# Patient Record
Sex: Male | Born: 1935
Health system: Southern US, Community
[De-identification: ages and names within clinical notes are randomized; demographics above are authoritative.]

## PROBLEM LIST (undated history)

## (undated) ENCOUNTER — Ambulatory Visit: Payer: Medicare HMO

## (undated) DIAGNOSIS — E785 Hyperlipidemia, unspecified: Secondary | ICD-10-CM

## (undated) DIAGNOSIS — I10 Essential (primary) hypertension: Secondary | ICD-10-CM

## (undated) HISTORY — PX: APPENDECTOMY: SHX54

## (undated) HISTORY — DX: Hyperlipidemia, unspecified: E78.5

---

## 2004-01-13 ENCOUNTER — Ambulatory Visit (HOSPITAL_COMMUNITY): Admission: RE | Admit: 2004-01-13 | Discharge: 2004-01-13 | Payer: Self-pay | Admitting: *Deleted

## 2006-04-25 ENCOUNTER — Ambulatory Visit (HOSPITAL_COMMUNITY): Admission: RE | Admit: 2006-04-25 | Discharge: 2006-04-25 | Payer: Self-pay | Admitting: *Deleted

## 2011-08-16 ENCOUNTER — Ambulatory Visit (INDEPENDENT_AMBULATORY_CARE_PROVIDER_SITE_OTHER): Payer: MEDICARE | Admitting: Emergency Medicine

## 2011-08-16 VITALS — BP 119/56 | HR 69 | Temp 97.8°F | Resp 16 | Ht 69.0 in | Wt 155.0 lb

## 2011-08-16 DIAGNOSIS — S61409A Unspecified open wound of unspecified hand, initial encounter: Secondary | ICD-10-CM

## 2011-08-16 DIAGNOSIS — Z23 Encounter for immunization: Secondary | ICD-10-CM

## 2011-08-16 MED ORDER — TETANUS-DIPHTH-ACELL PERTUSSIS 5-2.5-18.5 LF-MCG/0.5 IM SUSP
0.5000 mL | Freq: Once | INTRAMUSCULAR | Status: AC
  Administered 2011-08-16: 0.5 mL via INTRAMUSCULAR

## 2011-08-16 MED ORDER — DOXYCYCLINE HYCLATE 100 MG PO TABS: 100.0000 mg | ORAL_TABLET | Freq: Two times a day (BID) | ORAL | Status: AC

## 2011-08-16 NOTE — Progress Notes (Signed)
  Subjective:    Patient ID: Joshua Adkins, male    DOB: 11-09-1935, 76 y.o.   MRN: [id number]  HPI patient enters with a wound on his left hand he apparently was working on a set of dentures with a knife and the knife slipped and he lacerated the web space of his left hand. He closed it with the tape in enters today for a followup 24 hours later.    Review of Systems patient unclear of when he last had tetanus shot.     Objective:   Physical Exam physical exam reveals a 2 cm laceration in the webspace of the left hand the wound is between the index finger. Laceration of the skin here. There is no active bleeding. This area was cleaned thoroughly with peroxide. The wound margins were approximated with Steri-Strips. Covered with a sterile Telfa and wrap.        Assessment & Plan:   Assessment is wound pain. Wound is 24 hours duration. Because of this no sutures will be placed.

## 2011-11-01 ENCOUNTER — Other Ambulatory Visit: Payer: Self-pay | Admitting: Gastroenterology

## 2012-05-06 ENCOUNTER — Encounter (HOSPITAL_COMMUNITY): Payer: Self-pay | Admitting: Emergency Medicine

## 2012-05-06 ENCOUNTER — Emergency Department (HOSPITAL_COMMUNITY)
Admission: EM | Admit: 2012-05-06 | Discharge: 2012-05-07 | Disposition: A | Discharge status: Home / Self Care | Payer: Medicare Other | Attending: Emergency Medicine | Admitting: Emergency Medicine

## 2012-05-06 DIAGNOSIS — Z7982 Long term (current) use of aspirin: Secondary | ICD-10-CM | POA: Insufficient documentation

## 2012-05-06 DIAGNOSIS — I10 Essential (primary) hypertension: Secondary | ICD-10-CM | POA: Insufficient documentation

## 2012-05-06 DIAGNOSIS — Z79899 Other long term (current) drug therapy: Secondary | ICD-10-CM | POA: Insufficient documentation

## 2012-05-06 HISTORY — DX: Essential (primary) hypertension: I10

## 2012-05-06 NOTE — ED Notes (Signed)
Pt complains of blood pressure being high and feeling jittery; pt got on treadmill and blood pressure elevated from home readings.  Denies chest pain and SOB.

## 2012-05-07 NOTE — ED Provider Notes (Signed)
History     CSN: [id number]  Arrival date & time 05/06/12  2301   First MD Initiated Contact with Patient 05/06/12 2353      Chief Complaint  Patient presents with  . Hypertension    (Consider location/radiation/quality/duration/timing/severity/associated sxs/prior treatment) HPI 76 year male presents to emergency apartment complaining of elevated blood pressure. Patient reports he was feeling "jittery" this evening, and so he checked his blood pressure. His blood pressure when he checked it was 146 systolic. Patient got on the treadmill, when he got off the treadmill, his blood pressure was 175/80. He denies any chest pain, shortness of breath, headache, blurred vision or any focal neuro symptoms. Patient felt jittery. Patient is on 2 blood pressure medicines, one half of a 10 mg lisinopril, and one half of a Maxzide 75/50. Patient reports when he was on full-strength of these medications he felt like his blood pressure was too low. He denies any problems doing his nightly workout on the treadmill. Patient became concerned, and checked his blood pressure several more times, and reports the systolic continued to climb. He has continued to be asymptomatic.    Past Medical History  Diagnosis Date  . Hypertension     Past Surgical History  Procedure Date  . Appendectomy     History reviewed. No pertinent family history.  History  Substance Use Topics  . Smoking status: Never Smoker   . Smokeless tobacco: Not on file  . Alcohol Use: No      Review of Systems   See History of Present Illness; otherwise all other systems are reviewed and negative  Allergies  Caduet  Home Medications   Current Outpatient Rx  Name  Route  Sig  Dispense  Refill  . VITAMIN C 100 MG PO TABS   Oral   Take 100 mg by mouth daily.         . ASPIRIN EC 81 MG PO TBEC   Oral   Take 81 mg by mouth daily.         . ATORVASTATIN CALCIUM 10 MG PO TABS   Oral   Take 10 mg by mouth daily.        Marland Kitchen LISINOPRIL 10 MG PO TABS   Oral   Take 5 mg by mouth daily.          Marland Kitchen OVER THE COUNTER MEDICATION   Oral   Take 1 capsule by mouth daily. Fish oil with vitamin D         . TRIAMTERENE-HCTZ 37.5-25 MG PO CAPS   Oral   Take 1 capsule by mouth every morning.         Marland Kitchen VITAMIN E 100 UNITS PO CAPS   Oral   Take 100 Units by mouth every other day.            BP 149/60  Pulse 70  Temp 98.8 F (37.1 C) (Oral)  Resp 19  SpO2 100%  Physical Exam  Nursing note and vitals reviewed. Constitutional: He is oriented to person, place, and time. He appears well-developed and well-nourished.  HENT:  Head: Normocephalic and atraumatic.  Nose: Nose normal.  Mouth/Throat: Oropharynx is clear and moist.  Eyes: Conjunctivae normal and EOM are normal. Pupils are equal, round, and reactive to light.  Neck: Normal range of motion. Neck supple. No JVD present. No tracheal deviation present. No thyromegaly present.  Cardiovascular: Normal rate, regular rhythm, normal heart sounds and intact distal pulses.  Exam reveals no gallop  and no friction rub.   No murmur heard. Pulmonary/Chest: Effort normal and breath sounds normal. No stridor. No respiratory distress. He has no wheezes. He has no rales. He exhibits no tenderness.  Abdominal: Soft. Bowel sounds are normal. He exhibits no distension and no mass. There is no tenderness. There is no rebound and no guarding.  Musculoskeletal: Normal range of motion. He exhibits no edema and no tenderness.  Lymphadenopathy:    He has no cervical adenopathy.  Neurological: He is oriented to person, place, and time. He has normal reflexes. No cranial nerve deficit. He exhibits normal muscle tone. Coordination normal.  Skin: Skin is dry. No rash noted. No erythema. No pallor.  Psychiatric: He has a normal mood and affect. His behavior is normal. Judgment and thought content normal.    ED Course  Procedures (including critical care time)  Labs  Reviewed - No data to display No results found.   1. Hypertension       MDM  Patient with improved blood pressures here. No endorgan damage issues. Patient given the option of starting full-strength on one of his medications or keeping a log of his blood pressures twice a day until he can followup with his primary care Dr. on Friday. Do not feel patient has malignant hypertension or hypertensive urgency/crisis        Olivia Mackie, MD 05/07/12 0502

## 2012-10-22 ENCOUNTER — Other Ambulatory Visit: Payer: Self-pay | Admitting: Dermatology

## 2015-02-02 ENCOUNTER — Telehealth: Payer: Self-pay | Admitting: Internal Medicine

## 2015-02-02 NOTE — Telephone Encounter (Signed)
Pt called wanting to establish care with you. He states his daughter, Stephanie Coup, had asked you a while ago if you would take him on as a new patient and you agreed. Pt states daughter has passed since then.

## 2015-02-03 NOTE — Telephone Encounter (Signed)
Appt made

## 2015-02-03 NOTE — Telephone Encounter (Signed)
Ok Thx 

## 2015-02-17 ENCOUNTER — Encounter: Payer: Self-pay | Admitting: Internal Medicine

## 2015-02-17 ENCOUNTER — Ambulatory Visit (INDEPENDENT_AMBULATORY_CARE_PROVIDER_SITE_OTHER): Payer: Medicare Other | Admitting: Internal Medicine

## 2015-02-17 VITALS — BP 138/72 | HR 79 | Ht 69.5 in | Wt 150.0 lb

## 2015-02-17 DIAGNOSIS — Z0000 Encounter for general adult medical examination without abnormal findings: Secondary | ICD-10-CM | POA: Diagnosis not present

## 2015-02-17 DIAGNOSIS — I10 Essential (primary) hypertension: Secondary | ICD-10-CM | POA: Diagnosis not present

## 2015-02-17 DIAGNOSIS — H6121 Impacted cerumen, right ear: Secondary | ICD-10-CM | POA: Diagnosis not present

## 2015-02-17 DIAGNOSIS — H833X3 Noise effects on inner ear, bilateral: Secondary | ICD-10-CM

## 2015-02-17 DIAGNOSIS — H833X9 Noise effects on inner ear, unspecified ear: Secondary | ICD-10-CM | POA: Insufficient documentation

## 2015-02-17 DIAGNOSIS — H6120 Impacted cerumen, unspecified ear: Secondary | ICD-10-CM | POA: Insufficient documentation

## 2015-02-17 DIAGNOSIS — E785 Hyperlipidemia, unspecified: Secondary | ICD-10-CM | POA: Diagnosis not present

## 2015-02-17 DIAGNOSIS — N320 Bladder-neck obstruction: Secondary | ICD-10-CM

## 2015-02-17 DIAGNOSIS — H9190 Unspecified hearing loss, unspecified ear: Secondary | ICD-10-CM | POA: Insufficient documentation

## 2015-02-17 MED ORDER — ATORVASTATIN CALCIUM 10 MG PO TABS: 10.0000 mg | ORAL_TABLET | Freq: Every day | ORAL | Status: DC

## 2015-02-17 MED ORDER — TRIAMTERENE-HCTZ 37.5-25 MG PO TABS: 0.5000 | ORAL_TABLET | Freq: Every day | ORAL | Status: DC

## 2015-02-17 MED ORDER — LISINOPRIL 10 MG PO TABS: 5.0000 mg | ORAL_TABLET | Freq: Every day | ORAL | Status: DC

## 2015-02-17 NOTE — Assessment & Plan Note (Signed)
See procedure 

## 2015-02-17 NOTE — Assessment & Plan Note (Addendum)
  Here for medicare wellness/physical  Diet: heart healthy  Physical activity: not sedentary  Depression/mood screen: negative  Hearing: decreased Visual acuity: grossly normal, performs annual eye exam  ADLs: capable  Fall risk: none  Home safety: good  Cognitive evaluation: intact to orientation, naming, recall and repetition  EOL planning: adv directives, full code/ I agree  I have personally reviewed and have noted  1. The patient's medical, surgical and social history  2. Their use of alcohol, tobacco or illicit drugs  3. Their current medications and supplements  4. The patient's functional ability including ADL's, fall risks, home safety risks and hearing or visual impairment.  5. Diet and physical activities  6. Evidence for depression or mood disorders 7. The roster of all physicians providing medical care to patient - is listed in the Snapshot section of the chart and reviewed today.    Today patient counseled on age appropriate routine health concerns for screening and prevention, each reviewed and up to date or declined. Immunizations reviewed and up to date or declined. Labs ordered and reviewed. Risk factors for depression reviewed and negative. Hearing function and visual acuity are intact. ADLs screened and addressed as needed. Functional ability and level of safety reviewed and appropriate. Education, counseling and referrals performed based on assessed risks today. Patient provided with a copy of personalized plan for preventive services.   Colon Dr Paulita Fujita ?2013 Pt declined a flu shot Labs

## 2015-02-17 NOTE — Assessment & Plan Note (Signed)
On Lipitor 

## 2015-02-17 NOTE — Progress Notes (Signed)
Pre visit review using our clinic review tool, if applicable. No additional management support is needed unless otherwise documented below in the visit note. 

## 2015-02-17 NOTE — Patient Instructions (Signed)
Preventive Care for Adults A healthy lifestyle and preventive care can promote health and wellness. Preventive health guidelines for men include the following key practices:  A routine yearly physical is a good way to check with your health care provider about your health and preventative screening. It is a chance to share any concerns and updates on your health and to receive a thorough exam.  Visit your dentist for a routine exam and preventative care every 6 months. Brush your teeth twice a day and floss once a day. Good oral hygiene prevents tooth decay and gum disease.  The frequency of eye exams is based on your age, health, family medical history, use of contact lenses, and other factors. Follow your health care provider's recommendations for frequency of eye exams.  Eat a healthy diet. Foods such as vegetables, fruits, whole grains, low-fat dairy products, and lean protein foods contain the nutrients you need without too many calories. Decrease your intake of foods high in solid fats, added sugars, and salt. Eat the right amount of calories for you.Get information about a proper diet from your health care provider, if necessary.  Regular physical exercise is one of the most important things you can do for your health. Most adults should get at least 150 minutes of moderate-intensity exercise (any activity that increases your heart rate and causes you to sweat) each week. In addition, most adults need muscle-strengthening exercises on 2 or more days a week.  Maintain a healthy weight. The body mass index (BMI) is a screening tool to identify possible weight problems. It provides an estimate of body fat based on height and weight. Your health care provider can find your BMI and can help you achieve or maintain a healthy weight.For adults 20 years and older:  A BMI below 18.5 is considered underweight.  A BMI of 18.5 to 24.9 is normal.  A BMI of 25 to 29.9 is considered overweight.  A BMI  of 30 and above is considered obese.  Maintain normal blood lipids and cholesterol levels by exercising and minimizing your intake of saturated fat. Eat a balanced diet with plenty of fruit and vegetables. Blood tests for lipids and cholesterol should begin at age 50 and be repeated every 5 years. If your lipid or cholesterol levels are high, you are over 50, or you are at high risk for heart disease, you may need your cholesterol levels checked more frequently.Ongoing high lipid and cholesterol levels should be treated with medicines if diet and exercise are not working.  If you smoke, find out from your health care provider how to quit. If you do not use tobacco, do not start.  Lung cancer screening is recommended for adults aged 73-80 years who are at high risk for developing lung cancer because of a history of smoking. A yearly low-dose CT scan of the lungs is recommended for people who have at least a 30-pack-year history of smoking and are a current smoker or have quit within the past 15 years. A pack year of smoking is smoking an average of 1 pack of cigarettes a day for 1 year (for example: 1 pack a day for 30 years or 2 packs a day for 15 years). Yearly screening should continue until the smoker has stopped smoking for at least 15 years. Yearly screening should be stopped for people who develop a health problem that would prevent them from having lung cancer treatment.  If you choose to drink alcohol, do not have more than  2 drinks per day. One drink is considered to be 12 ounces (355 mL) of beer, 5 ounces (148 mL) of wine, or 1.5 ounces (44 mL) of liquor.  Avoid use of street drugs. Do not share needles with anyone. Ask for help if you need support or instructions about stopping the use of drugs.  High blood pressure causes heart disease and increases the risk of stroke. Your blood pressure should be checked at least every 1-2 years. Ongoing high blood pressure should be treated with  medicines, if weight loss and exercise are not effective.  If you are 45-79 years old, ask your health care provider if you should take aspirin to prevent heart disease.  Diabetes screening involves taking a blood sample to check your fasting blood sugar level. This should be done once every 3 years, after age 45, if you are within normal weight and without risk factors for diabetes. Testing should be considered at a younger age or be carried out more frequently if you are overweight and have at least 1 risk factor for diabetes.  Colorectal cancer can be detected and often prevented. Most routine colorectal cancer screening begins at the age of 50 and continues through age 75. However, your health care provider may recommend screening at an earlier age if you have risk factors for colon cancer. On a yearly basis, your health care provider may provide home test kits to check for hidden blood in the stool. Use of a small camera at the end of a tube to directly examine the colon (sigmoidoscopy or colonoscopy) can detect the earliest forms of colorectal cancer. Talk to your health care provider about this at age 50, when routine screening begins. Direct exam of the colon should be repeated every 5-10 years through age 75, unless early forms of precancerous polyps or small growths are found.  People who are at an increased risk for hepatitis B should be screened for this virus. You are considered at high risk for hepatitis B if:  You were born in a country where hepatitis B occurs often. Talk with your health care provider about which countries are considered high risk.  Your parents were born in a high-risk country and you have not received a shot to protect against hepatitis B (hepatitis B vaccine).  You have HIV or AIDS.  You use needles to inject street drugs.  You live with, or have sex with, someone who has hepatitis B.  You are a man who has sex with other men (MSM).  You get hemodialysis  treatment.  You take certain medicines for conditions such as cancer, organ transplantation, and autoimmune conditions.  Hepatitis C blood testing is recommended for all people born from 1945 through 1965 and any individual with known risks for hepatitis C.  Practice safe sex. Use condoms and avoid high-risk sexual practices to reduce the spread of sexually transmitted infections (STIs). STIs include gonorrhea, chlamydia, syphilis, trichomonas, herpes, HPV, and human immunodeficiency virus (HIV). Herpes, HIV, and HPV are viral illnesses that have no cure. They can result in disability, cancer, and death.  If you are at risk of being infected with HIV, it is recommended that you take a prescription medicine daily to prevent HIV infection. This is called preexposure prophylaxis (PrEP). You are considered at risk if:  You are a man who has sex with other men (MSM) and have other risk factors.  You are a heterosexual man, are sexually active, and are at increased risk for HIV infection.    You take drugs by injection.  You are sexually active with a partner who has HIV.  Talk with your health care provider about whether you are at high risk of being infected with HIV. If you choose to begin PrEP, you should first be tested for HIV. You should then be tested every 3 months for as long as you are taking PrEP.  A one-time screening for abdominal aortic aneurysm (AAA) and surgical repair of large AAAs by ultrasound are recommended for men ages 51 to 13 years who are current or former smokers.  Healthy men should no longer receive prostate-specific antigen (PSA) blood tests as part of routine cancer screening. Talk with your health care provider about prostate cancer screening.  Testicular cancer screening is not recommended for adult males who have no symptoms. Screening includes self-exam, a health care provider exam, and other screening tests. Consult with your health care provider about any symptoms  you have or any concerns you have about testicular cancer.  Use sunscreen. Apply sunscreen liberally and repeatedly throughout the day. You should seek shade when your shadow is shorter than you. Protect yourself by wearing long sleeves, pants, a wide-brimmed hat, and sunglasses year round, whenever you are outdoors.  Once a month, do a whole-body skin exam, using a mirror to look at the skin on your back. Tell your health care provider about new moles, moles that have irregular borders, moles that are larger than a pencil eraser, or moles that have changed in shape or color.  Stay current with required vaccines (immunizations).  Influenza vaccine. All adults should be immunized every year.  Tetanus, diphtheria, and acellular pertussis (Td, Tdap) vaccine. An adult who has not previously received Tdap or who does not know his vaccine status should receive 1 dose of Tdap. This initial dose should be followed by tetanus and diphtheria toxoids (Td) booster doses every 10 years. Adults with an unknown or incomplete history of completing a 3-dose immunization series with Td-containing vaccines should begin or complete a primary immunization series including a Tdap dose. Adults should receive a Td booster every 10 years.  Varicella vaccine. An adult without evidence of immunity to varicella should receive 2 doses or a second dose if he has previously received 1 dose.  Human papillomavirus (HPV) vaccine. Males aged 84-21 years who have not received the vaccine previously should receive the 3-dose series. Males aged 22-26 years may be immunized. Immunization is recommended through the age of 57 years for any male who has sex with males and did not get any or all doses earlier. Immunization is recommended for any person with an immunocompromised condition through the age of 71 years if he did not get any or all doses earlier. During the 3-dose series, the second dose should be obtained 4-8 weeks after the first  dose. The third dose should be obtained 24 weeks after the first dose and 16 weeks after the second dose.  Zoster vaccine. One dose is recommended for adults aged 73 years or older unless certain conditions are present.  Measles, mumps, and rubella (MMR) vaccine. Adults born before [birth year] generally are considered immune to measles and mumps. Adults born in [birth year] or later should have 1 or more doses of MMR vaccine unless there is a contraindication to the vaccine or there is laboratory evidence of immunity to each of the three diseases. A routine second dose of MMR vaccine should be obtained at least 28 days after the first dose for students attending postsecondary  schools, health care workers, or international travelers. People who received inactivated measles vaccine or an unknown type of measles vaccine during 1963-1967 should receive 2 doses of MMR vaccine. People who received inactivated mumps vaccine or an unknown type of mumps vaccine before 1979 and are at high risk for mumps infection should consider immunization with 2 doses of MMR vaccine. Unvaccinated health care workers born before [birth year] who lack laboratory evidence of measles, mumps, or rubella immunity or laboratory confirmation of disease should consider measles and mumps immunization with 2 doses of MMR vaccine or rubella immunization with 1 dose of MMR vaccine.  Pneumococcal 13-valent conjugate (PCV13) vaccine. When indicated, a person who is uncertain of his immunization history and has no record of immunization should receive the PCV13 vaccine. An adult aged 74 years or older who has certain medical conditions and has not been previously immunized should receive 1 dose of PCV13 vaccine. This PCV13 should be followed with a dose of pneumococcal polysaccharide (PPSV23) vaccine. The PPSV23 vaccine dose should be obtained at least 8 weeks after the dose of PCV13 vaccine. An adult aged 39 years or older who has certain medical conditions and  previously received 1 or more doses of PPSV23 vaccine should receive 1 dose of PCV13. The PCV13 vaccine dose should be obtained 1 or more years after the last PPSV23 vaccine dose.  Pneumococcal polysaccharide (PPSV23) vaccine. When PCV13 is also indicated, PCV13 should be obtained first. All adults aged 57 years and older should be immunized. An adult younger than age 27 years who has certain medical conditions should be immunized. Any person who resides in a nursing home or long-term care facility should be immunized. An adult smoker should be immunized. People with an immunocompromised condition and certain other conditions should receive both PCV13 and PPSV23 vaccines. People with human immunodeficiency virus (HIV) infection should be immunized as soon as possible after diagnosis. Immunization during chemotherapy or radiation therapy should be avoided. Routine use of PPSV23 vaccine is not recommended for American Indians, [place] Natives, or people younger than 65 years unless there are medical conditions that require PPSV23 vaccine. When indicated, people who have unknown immunization and have no record of immunization should receive PPSV23 vaccine. One-time revaccination 5 years after the first dose of PPSV23 is recommended for people aged 19-64 years who have chronic kidney failure, nephrotic syndrome, asplenia, or immunocompromised conditions. People who received 1-2 doses of PPSV23 before age 86 years should receive another dose of PPSV23 vaccine at age 23 years or later if at least 5 years have passed since the previous dose. Doses of PPSV23 are not needed for people immunized with PPSV23 at or after age 51 years.  Meningococcal vaccine. Adults with asplenia or persistent complement component deficiencies should receive 2 doses of quadrivalent meningococcal conjugate (MenACWY-D) vaccine. The doses should be obtained at least 2 months apart. Microbiologists working with certain meningococcal bacteria,  [employer], people at risk during an outbreak, and people who travel to or live in countries with a high rate of meningitis should be immunized. A first-year college student up through age 58 years who is living in a residence hall should receive a dose if he did not receive a dose on or after his 16th birthday. Adults who have certain high-risk conditions should receive one or more doses of vaccine.  Hepatitis A vaccine. Adults who wish to be protected from this disease, have certain high-risk conditions, work with hepatitis A-infected animals, work in hepatitis A research labs, or  travel to or work in countries with a high rate of hepatitis A should be immunized. Adults who were previously unvaccinated and who anticipate close contact with an international adoptee during the first 60 days after arrival in the [place] from a country with a high rate of hepatitis A should be immunized.  Hepatitis B vaccine. Adults should be immunized if they wish to be protected from this disease, have certain high-risk conditions, may be exposed to blood or other infectious body fluids, are household contacts or sex partners of hepatitis B positive people, are clients or workers in certain care facilities, or travel to or work in countries with a high rate of hepatitis B.  Haemophilus influenzae type b (Hib) vaccine. A previously unvaccinated person with asplenia or sickle cell disease or having a scheduled splenectomy should receive 1 dose of Hib vaccine. Regardless of previous immunization, a recipient of a hematopoietic stem cell transplant should receive a 3-dose series 6-12 months after his successful transplant. Hib vaccine is not recommended for adults with HIV infection. Preventive Service / Frequency Ages 52 to 17  Blood pressure check.** / Every 1 to 2 years.  Lipid and cholesterol check.** / Every 5 years beginning at age 69.  Hepatitis C blood test.** / For any individual with known risks for  hepatitis C.  Skin self-exam. / Monthly.  Influenza vaccine. / Every year.  Tetanus, diphtheria, and acellular pertussis (Tdap, Td) vaccine.** / Consult your health care provider. 1 dose of Td every 10 years.  Varicella vaccine.** / Consult your health care provider.  HPV vaccine. / 3 doses over 6 months, if 72 or younger.  Measles, mumps, rubella (MMR) vaccine.** / You need at least 1 dose of MMR if you were born in 1957 or later. You may also need a second dose.  Pneumococcal 13-valent conjugate (PCV13) vaccine.** / Consult your health care provider.  Pneumococcal polysaccharide (PPSV23) vaccine.** / 1 to 2 doses if you smoke cigarettes or if you have certain conditions.  Meningococcal vaccine.** / 1 dose if you are age 35 to 60 years and a Market researcher living in a residence hall, or have one of several medical conditions. You may also need additional booster doses.  Hepatitis A vaccine.** / Consult your health care provider.  Hepatitis B vaccine.** / Consult your health care provider.  Haemophilus influenzae type b (Hib) vaccine.** / Consult your health care provider. Ages 35 to 8  Blood pressure check.** / Every 1 to 2 years.  Lipid and cholesterol check.** / Every 5 years beginning at age 57.  Lung cancer screening. / Every year if you are aged 44-80 years and have a 30-pack-year history of smoking and currently smoke or have quit within the past 15 years. Yearly screening is stopped once you have quit smoking for at least 15 years or develop a health problem that would prevent you from having lung cancer treatment.  Fecal occult blood test (FOBT) of stool. / Every year beginning at age 55 and continuing until age 73. You may not have to do this test if you get a colonoscopy every 10 years.  Flexible sigmoidoscopy** or colonoscopy.** / Every 5 years for a flexible sigmoidoscopy or every 10 years for a colonoscopy beginning at age 28 and continuing until age  1.  Hepatitis C blood test.** / For all people born from [birth year] through 1965 and any individual with known risks for hepatitis C.  Skin self-exam. / Monthly.  Influenza vaccine. / Every  year.  Tetanus, diphtheria, and acellular pertussis (Tdap/Td) vaccine.** / Consult your health care provider. 1 dose of Td every 10 years.  Varicella vaccine.** / Consult your health care provider.  Zoster vaccine.** / 1 dose for adults aged 53 years or older.  Measles, mumps, rubella (MMR) vaccine.** / You need at least 1 dose of MMR if you were born in 1955 or later. You may also need a second dose.  Pneumococcal 13-valent conjugate (PCV13) vaccine.** / Consult your health care provider.  Pneumococcal polysaccharide (PPSV23) vaccine.** / 1 to 2 doses if you smoke cigarettes or if you have certain conditions.  Meningococcal vaccine.** / Consult your health care provider.  Hepatitis A vaccine.** / Consult your health care provider.  Hepatitis B vaccine.** / Consult your health care provider.  Haemophilus influenzae type b (Hib) vaccine.** / Consult your health care provider. Ages 77 and over  Blood pressure check.** / Every 1 to 2 years.  Lipid and cholesterol check.**/ Every 5 years beginning at age 85.  Lung cancer screening. / Every year if you are aged 55-80 years and have a 30-pack-year history of smoking and currently smoke or have quit within the past 15 years. Yearly screening is stopped once you have quit smoking for at least 15 years or develop a health problem that would prevent you from having lung cancer treatment.  Fecal occult blood test (FOBT) of stool. / Every year beginning at age 33 and continuing until age 11. You may not have to do this test if you get a colonoscopy every 10 years.  Flexible sigmoidoscopy** or colonoscopy.** / Every 5 years for a flexible sigmoidoscopy or every 10 years for a colonoscopy beginning at age 28 and continuing until age 73.  Hepatitis C blood  test.** / For all people born from [birth year] through 1963 and any individual with known risks for hepatitis C.  Abdominal aortic aneurysm (AAA) screening.** / A one-time screening for ages 50 to 27 years who are current or former smokers.  Skin self-exam. / Monthly.  Influenza vaccine. / Every year.  Tetanus, diphtheria, and acellular pertussis (Tdap/Td) vaccine.** / 1 dose of Td every 10 years.  Varicella vaccine.** / Consult your health care provider.  Zoster vaccine.** / 1 dose for adults aged 34 years or older.  Pneumococcal 13-valent conjugate (PCV13) vaccine.** / Consult your health care provider.  Pneumococcal polysaccharide (PPSV23) vaccine.** / 1 dose for all adults aged 63 years and older.  Meningococcal vaccine.** / Consult your health care provider.  Hepatitis A vaccine.** / Consult your health care provider.  Hepatitis B vaccine.** / Consult your health care provider.  Haemophilus influenzae type b (Hib) vaccine.** / Consult your health care provider. **Family history and personal history of risk and conditions may change your health care provider's recommendations. Document Released: 08/06/2001 Document Revised: 06/15/2013 Document Reviewed: 11/05/2010 [facility] Patient Information 2015 [place], [place]. This information is not intended to replace advice given to you by your health care provider. Make sure you discuss any questions you have with your health care provider.

## 2015-02-17 NOTE — Assessment & Plan Note (Signed)
Chronic 30+ years Maxzide, Lisinopril

## 2015-02-17 NOTE — Progress Notes (Signed)
Subjective:  Patient ID: Joshua Adkins, male    DOB: [birth year]  Age: 79 y.o. MRN: [id number]  CC: Establish Care   HPI [name] presents for a new pt visit - well exam  Outpatient Prescriptions Prior to Visit  Medication Sig Dispense Refill  . Ascorbic Acid (VITAMIN C) 100 MG tablet Take 100 mg by mouth daily.    Marland Kitchen aspirin EC 81 MG tablet Take 81 mg by mouth daily.    Marland Kitchen atorvastatin (LIPITOR) 10 MG tablet Take 10 mg by mouth daily.    Marland Kitchen lisinopril (PRINIVIL,ZESTRIL) 10 MG tablet Take 5 mg by mouth daily.     Marland Kitchen OVER THE COUNTER MEDICATION Take 1 capsule by mouth daily. Fish oil with vitamin D    . triamterene-hydrochlorothiazide (DYAZIDE) 37.5-25 MG per capsule Take 1 capsule by mouth every morning.    . vitamin E 100 UNIT capsule Take 100 Units by mouth every other day.      No facility-administered medications prior to visit.    ROS Review of Systems  Constitutional: Negative for appetite change, fatigue and unexpected weight change.  HENT: Positive for hearing loss. Negative for congestion, ear discharge, ear pain, nosebleeds, sneezing, sore throat and trouble swallowing.   Eyes: Negative for itching and visual disturbance.  Respiratory: Negative for cough.   Cardiovascular: Negative for chest pain, palpitations and leg swelling.  Gastrointestinal: Negative for nausea, diarrhea, blood in stool and abdominal distention.  Genitourinary: Negative for frequency and hematuria.  Musculoskeletal: Negative for back pain, joint swelling, gait problem and neck pain.  Skin: Negative for rash.  Neurological: Negative for dizziness, tremors, speech difficulty and weakness.  Psychiatric/Behavioral: Negative for suicidal ideas, behavioral problems, sleep disturbance, self-injury, dysphoric mood and agitation. The patient is not nervous/anxious.     Objective:  BP 138/72 mmHg  Pulse 79  Ht 5' 9.5" (1.765 m)  Wt 150 lb (68.04 kg)  BMI 21.84 kg/m2  SpO2 97%  BP Readings from  Last 3 Encounters:  02/17/15 138/72  05/07/12 149/60  08/16/11 119/56    Wt Readings from Last 3 Encounters:  02/17/15 150 lb (68.04 kg)  08/16/11 155 lb (70.308 kg)    Physical Exam  Constitutional: He is oriented to person, place, and time. He appears well-developed and well-nourished. No distress.  HENT:  Head: Normocephalic and atraumatic.  Right Ear: External ear normal.  Left Ear: External ear normal.  Nose: Nose normal.  Mouth/Throat: Oropharynx is clear and moist. No oropharyngeal exudate.  Eyes: Conjunctivae and EOM are normal. Pupils are equal, round, and reactive to light. Right eye exhibits no discharge. Left eye exhibits no discharge. No scleral icterus.  Neck: Normal range of motion. Neck supple. No JVD present. No tracheal deviation present. No thyromegaly present.  Cardiovascular: Normal rate, regular rhythm, normal heart sounds and intact distal pulses.  Exam reveals no gallop and no friction rub.   No murmur heard. Pulmonary/Chest: Effort normal and breath sounds normal. No stridor. No respiratory distress. He has no wheezes. He has no rales. He exhibits no tenderness.  Abdominal: Soft. Bowel sounds are normal. He exhibits no distension and no mass. There is no tenderness. There is no rebound and no guarding.  Genitourinary: Rectum normal, prostate normal and penis normal. Guaiac negative stool. No penile tenderness.  Musculoskeletal: Normal range of motion. He exhibits no edema or tenderness.  Lymphadenopathy:    He has no cervical adenopathy.  Neurological: He is alert and oriented to person, place, and time.  He has normal reflexes. No cranial nerve deficit. He exhibits normal muscle tone. Coordination normal.  Skin: Skin is warm and dry. No rash noted. He is not diaphoretic. No erythema. No pallor.  Psychiatric: He has a normal mood and affect. His behavior is normal. Judgment and thought content normal.  Wax R Hard hearing  EKG NSR   Procedure Note :      Procedure :  Ear irrigation   Indication:  Cerumen impaction R   Risks, including pain, dizziness, eardrum perforation, bleeding, infection and others as well as benefits were explained to the patient in detail. Verbal consent was obtained and the patient agreed to proceed.    We used "The Elephant Ear Irrigation Device" filled with lukewarm water for irrigation. A large amount wax was recovered. Procedure has also required manual wax removal with an ear loop.   Tolerated well. Complications: None.   Postprocedure instructions :  Call if problems.    No results found for: WBC, HGB, HCT, PLT, GLUCOSE, CHOL, TRIG, HDL, LDLDIRECT, LDLCALC, ALT, AST, NA, K, CL, CREATININE, BUN, CO2, TSH, PSA, INR, GLUF, HGBA1C, MICROALBUR  No results found.  Assessment & Plan:   [name] was seen today for establish care.  Diagnoses and all orders for this visit:  Well adult exam -     EKG 12-Lead -     CBC with Differential/Platelet; Future -     Basic metabolic panel; Future -     Hepatic function panel; Future -     Lipid panel; Future -     PSA; Future -     TSH; Future -     Urinalysis; Future  Essential hypertension -     CBC with Differential/Platelet; Future -     Basic metabolic panel; Future -     Hepatic function panel; Future -     Lipid panel; Future -     PSA; Future -     TSH; Future -     Urinalysis; Future  Dyslipidemia -     CBC with Differential/Platelet; Future -     Basic metabolic panel; Future -     Hepatic function panel; Future -     Lipid panel; Future -     PSA; Future -     TSH; Future -     Urinalysis; Future  Cerumen impaction, right -     CBC with Differential/Platelet; Future -     Basic metabolic panel; Future -     Hepatic function panel; Future -     Lipid panel; Future -     PSA; Future -     TSH; Future -     Urinalysis; Future  Hearing loss d/t noise, bilateral -     Ambulatory referral to Audiology -     CBC with Differential/Platelet;  Future -     Basic metabolic panel; Future -     Hepatic function panel; Future -     Lipid panel; Future -     PSA; Future -     TSH; Future -     Urinalysis; Future  Bladder neck obstruction -     PSA; Future  Other orders -     atorvastatin (LIPITOR) 10 MG tablet; Take 1 tablet (10 mg total) by mouth daily. -     triamterene-hydrochlorothiazide (MAXZIDE-25) 37.5-25 MG per tablet; Take 0.5 tablets by mouth daily. -     lisinopril (PRINIVIL,ZESTRIL) 10 MG tablet; Take 0.5 tablets (5 mg total)  by mouth daily.  I have discontinued Mr. Capri's vitamin E, OVER THE COUNTER MEDICATION, and Fish Oil. I have also changed his atorvastatin and lisinopril. Additionally, I am having him maintain his vitamin C, aspirin EC, Cholecalciferol, and triamterene-hydrochlorothiazide.  Meds ordered this encounter  Medications  . Cholecalciferol 1000 UNITS tablet    Sig: Take 1,000 Units by mouth every other day.  Marland Kitchen DISCONTD: Omega-3 Fatty Acids (FISH OIL) 1200 MG CAPS    Sig: Take by mouth daily.  Marland Kitchen DISCONTD: triamterene-hydrochlorothiazide (MAXZIDE-25) 37.5-25 MG per tablet    Sig: Take 0.5 tablets by mouth daily.    Refill:  1  . atorvastatin (LIPITOR) 10 MG tablet    Sig: Take 1 tablet (10 mg total) by mouth daily.    Dispense:  180 tablet    Refill:  1  . triamterene-hydrochlorothiazide (MAXZIDE-25) 37.5-25 MG per tablet    Sig: Take 0.5 tablets by mouth daily.    Dispense:  90 tablet    Refill:  3  . lisinopril (PRINIVIL,ZESTRIL) 10 MG tablet    Sig: Take 0.5 tablets (5 mg total) by mouth daily.    Dispense:  90 tablet    Refill:  3     Follow-up: Return in about 6 months (around 08/20/2015) for a follow-up visit.  Walker Kehr, MD

## 2015-02-17 NOTE — Assessment & Plan Note (Signed)
Audiol ref 

## 2015-02-21 ENCOUNTER — Other Ambulatory Visit (INDEPENDENT_AMBULATORY_CARE_PROVIDER_SITE_OTHER): Payer: Medicare Other

## 2015-02-21 DIAGNOSIS — I10 Essential (primary) hypertension: Secondary | ICD-10-CM | POA: Diagnosis not present

## 2015-02-21 DIAGNOSIS — H833X3 Noise effects on inner ear, bilateral: Secondary | ICD-10-CM

## 2015-02-21 DIAGNOSIS — H6121 Impacted cerumen, right ear: Secondary | ICD-10-CM | POA: Diagnosis not present

## 2015-02-21 DIAGNOSIS — Z0000 Encounter for general adult medical examination without abnormal findings: Secondary | ICD-10-CM

## 2015-02-21 DIAGNOSIS — E785 Hyperlipidemia, unspecified: Secondary | ICD-10-CM

## 2015-02-21 DIAGNOSIS — N320 Bladder-neck obstruction: Secondary | ICD-10-CM

## 2015-02-21 LAB — BASIC METABOLIC PANEL
BUN: 32 mg/dL — ABNORMAL HIGH (ref 6–23)
CALCIUM: 9.5 mg/dL (ref 8.4–10.5)
CO2: 31 meq/L (ref 19–32)
CREATININE: 1.25 mg/dL (ref 0.40–1.50)
Chloride: 105 mEq/L (ref 96–112)
GFR: 59.49 mL/min — ABNORMAL LOW (ref >60.00)
GLUCOSE: 93 mg/dL (ref 70–99)
Potassium: 4.8 mEq/L (ref 3.5–5.1)
Sodium: 141 mEq/L (ref 135–145)

## 2015-02-21 LAB — HEPATIC FUNCTION PANEL
ALBUMIN: 4.1 g/dL (ref 3.5–5.2)
ALK PHOS: 42 U/L (ref 39–117)
ALT: 2 U/L (ref 0–53)
AST: 17 U/L (ref 0–37)
Bilirubin, Direct: 0.1 mg/dL (ref 0.0–0.3)
TOTAL PROTEIN: 6.6 g/dL (ref 6.0–8.3)
Total Bilirubin: 0.4 mg/dL (ref 0.2–1.2)

## 2015-02-21 LAB — URINALYSIS
BILIRUBIN URINE: NEGATIVE
HGB URINE DIPSTICK: NEGATIVE
Ketones, ur: NEGATIVE
Leukocytes, UA: NEGATIVE
Nitrite: NEGATIVE
Specific Gravity, Urine: 1.015 (ref 1.000–1.030)
Total Protein, Urine: NEGATIVE
URINE GLUCOSE: NEGATIVE
UROBILINOGEN UA: 0.2 (ref 0.0–1.0)
pH: 7 (ref 5.0–8.0)

## 2015-02-21 LAB — PSA: PSA: 1.2 ng/mL (ref 0.10–4.00)

## 2015-02-21 LAB — CBC WITH DIFFERENTIAL/PLATELET
Basophils Absolute: 0 10*3/uL (ref 0.0–0.1)
Basophils Relative: 0.2 % (ref 0.0–3.0)
EOS PCT: 0 % (ref 0.0–5.0)
Eosinophils Absolute: 0 10*3/uL (ref 0.0–0.7)
HCT: 35.3 % — ABNORMAL LOW (ref 39.0–52.0)
HEMOGLOBIN: 12 g/dL — AB (ref 13.0–17.0)
LYMPHS PCT: 42.3 % (ref 12.0–46.0)
Lymphs Abs: 2.4 10*3/uL (ref 0.7–4.0)
MCHC: 34 g/dL (ref 30.0–36.0)
MCV: 86.6 fl (ref 78.0–100.0)
MONOS PCT: 9.7 % (ref 3.0–12.0)
Monocytes Absolute: 0.5 10*3/uL (ref 0.1–1.0)
Neutro Abs: 2.7 10*3/uL (ref 1.4–7.7)
Neutrophils Relative %: 47.8 % (ref 43.0–77.0)
Platelets: 124 10*3/uL — ABNORMAL LOW (ref 150.0–400.0)
RBC: 4.08 Mil/uL — AB (ref 4.22–5.81)
RDW: 14.8 % (ref 11.5–15.5)
WBC: 5.6 10*3/uL (ref 4.0–10.5)

## 2015-02-21 LAB — LIPID PANEL
Cholesterol: 201 mg/dL — ABNORMAL HIGH (ref 0–200)
HDL: 38.1 mg/dL — AB (ref >39.00)
LDL Cholesterol: 127 mg/dL — ABNORMAL HIGH (ref 0–99)
NONHDL: 162.49
TRIGLYCERIDES: 176 mg/dL — AB (ref 0.0–149.0)
Total CHOL/HDL Ratio: 5
VLDL: 35.2 mg/dL (ref 0.0–40.0)

## 2015-02-21 LAB — TSH: TSH: 2.66 u[IU]/mL (ref 0.35–4.50)

## 2015-03-02 ENCOUNTER — Other Ambulatory Visit: Payer: Self-pay | Admitting: *Deleted

## 2015-03-02 DIAGNOSIS — D649 Anemia, unspecified: Secondary | ICD-10-CM

## 2015-06-08 ENCOUNTER — Other Ambulatory Visit (INDEPENDENT_AMBULATORY_CARE_PROVIDER_SITE_OTHER): Payer: Medicare Other

## 2015-06-08 DIAGNOSIS — D649 Anemia, unspecified: Secondary | ICD-10-CM

## 2015-06-08 LAB — CBC WITH DIFFERENTIAL/PLATELET
BASOS PCT: 0.2 % (ref 0.0–3.0)
Basophils Absolute: 0 10*3/uL (ref 0.0–0.1)
EOS ABS: 0 10*3/uL (ref 0.0–0.7)
EOS PCT: 0 % (ref 0.0–5.0)
HCT: 36.9 % — ABNORMAL LOW (ref 39.0–52.0)
HEMOGLOBIN: 12.3 g/dL — AB (ref 13.0–17.0)
LYMPHS ABS: 2.2 10*3/uL (ref 0.7–4.0)
Lymphocytes Relative: 39.7 % (ref 12.0–46.0)
MCHC: 33.3 g/dL (ref 30.0–36.0)
MCV: 86.8 fl (ref 78.0–100.0)
MONO ABS: 0.6 10*3/uL (ref 0.1–1.0)
Monocytes Relative: 9.8 % (ref 3.0–12.0)
NEUTROS ABS: 2.8 10*3/uL (ref 1.4–7.7)
Neutrophils Relative %: 50.3 % (ref 43.0–77.0)
PLATELETS: 137 10*3/uL — AB (ref 150.0–400.0)
RBC: 4.24 Mil/uL (ref 4.22–5.81)
RDW: 14.2 % (ref 11.5–15.5)
WBC: 5.6 10*3/uL (ref 4.0–10.5)

## 2015-06-08 LAB — IRON AND TIBC
%SAT: 33 % (ref 15–60)
IRON: 85 ug/dL (ref 50–180)
TIBC: 258 ug/dL (ref 250–425)
UIBC: 173 ug/dL (ref 125–400)

## 2015-08-18 ENCOUNTER — Ambulatory Visit: Payer: Medicare Other | Admitting: Internal Medicine

## 2015-09-05 ENCOUNTER — Encounter: Payer: Self-pay | Admitting: Internal Medicine

## 2015-09-05 ENCOUNTER — Ambulatory Visit (INDEPENDENT_AMBULATORY_CARE_PROVIDER_SITE_OTHER): Payer: Medicare Other | Admitting: Internal Medicine

## 2015-09-05 VITALS — BP 160/90 | HR 72 | Wt 152.0 lb

## 2015-09-05 DIAGNOSIS — W57XXXA Bitten or stung by nonvenomous insect and other nonvenomous arthropods, initial encounter: Secondary | ICD-10-CM

## 2015-09-05 DIAGNOSIS — H833X3 Noise effects on inner ear, bilateral: Secondary | ICD-10-CM

## 2015-09-05 DIAGNOSIS — T148 Other injury of unspecified body region: Secondary | ICD-10-CM

## 2015-09-05 DIAGNOSIS — E785 Hyperlipidemia, unspecified: Secondary | ICD-10-CM

## 2015-09-05 DIAGNOSIS — I10 Essential (primary) hypertension: Secondary | ICD-10-CM | POA: Diagnosis not present

## 2015-09-05 MED ORDER — LOSARTAN POTASSIUM 100 MG PO TABS: 100.0000 mg | ORAL_TABLET | Freq: Every day | ORAL | Status: DC

## 2015-09-05 NOTE — Assessment & Plan Note (Signed)
Pt decided against hearing aids at present

## 2015-09-05 NOTE — Progress Notes (Signed)
Pre visit review using our clinic review tool, if applicable. No additional management support is needed unless otherwise documented below in the visit note. 

## 2015-09-05 NOTE — Assessment & Plan Note (Signed)
On Lipitor 

## 2015-09-05 NOTE — Progress Notes (Signed)
Subjective:  Patient ID: Joshua Adkins, male    DOB: Mar 10, 1936  Age: 80 y.o. MRN: [id number]  CC: No chief complaint on file.   HPI BEATRIZ JENNISON presents for HTN, dyslipidemia f/u. He had tic bite in summer of 2016 w/mild topical rash and no other sx's  Outpatient Prescriptions Prior to Visit  Medication Sig Dispense Refill  . aspirin EC 81 MG tablet Take 81 mg by mouth daily.    Marland Kitchen atorvastatin (LIPITOR) 10 MG tablet Take 1 tablet (10 mg total) by mouth daily. 180 tablet 1  . Cholecalciferol 1000 UNITS tablet Take 1,000 Units by mouth every other day.    . triamterene-hydrochlorothiazide (MAXZIDE-25) 37.5-25 MG per tablet Take 0.5 tablets by mouth daily. 90 tablet 3  . lisinopril (PRINIVIL,ZESTRIL) 10 MG tablet Take 0.5 tablets (5 mg total) by mouth daily. 90 tablet 3  . Ascorbic Acid (VITAMIN C) 100 MG tablet Take 100 mg by mouth daily. Reported on 09/05/2015     No facility-administered medications prior to visit.    ROS Review of Systems  Constitutional: Negative for appetite change, fatigue and unexpected weight change.  HENT: Negative for congestion, nosebleeds, sneezing, sore throat and trouble swallowing.   Eyes: Negative for itching and visual disturbance.  Respiratory: Negative for cough.   Cardiovascular: Negative for chest pain, palpitations and leg swelling.  Gastrointestinal: Negative for nausea, diarrhea, blood in stool and abdominal distention.  Genitourinary: Negative for frequency and hematuria.  Musculoskeletal: Negative for back pain, joint swelling, gait problem and neck pain.  Skin: Negative for rash.  Neurological: Negative for dizziness, tremors, speech difficulty and weakness.  Psychiatric/Behavioral: Negative for sleep disturbance, dysphoric mood and agitation. The patient is not nervous/anxious.     Objective:  BP 178/76 mmHg  Pulse 72  Wt 152 lb (68.947 kg)  SpO2 96%  BP Readings from Last 3 Encounters:  09/05/15 178/76  02/17/15 138/72    05/07/12 149/60    Wt Readings from Last 3 Encounters:  09/05/15 152 lb (68.947 kg)  02/17/15 150 lb (68.04 kg)  08/16/11 155 lb (70.308 kg)    Physical Exam  Constitutional: He is oriented to person, place, and time. He appears well-developed. No distress.  NAD  HENT:  Mouth/Throat: Oropharynx is clear and moist.  Eyes: Conjunctivae are normal. Pupils are equal, round, and reactive to light.  Neck: Normal range of motion. No JVD present. No thyromegaly present.  Cardiovascular: Normal rate, regular rhythm, normal heart sounds and intact distal pulses.  Exam reveals no gallop and no friction rub.   No murmur heard. Pulmonary/Chest: Effort normal and breath sounds normal. No respiratory distress. He has no wheezes. He has no rales. He exhibits no tenderness.  Abdominal: Soft. Bowel sounds are normal. He exhibits no distension and no mass. There is no tenderness. There is no rebound and no guarding.  Musculoskeletal: Normal range of motion. He exhibits no edema or tenderness.  Lymphadenopathy:    He has no cervical adenopathy.  Neurological: He is alert and oriented to person, place, and time. He has normal reflexes. No cranial nerve deficit. He exhibits normal muscle tone. He displays a negative Romberg sign. Coordination and gait normal.  Skin: Skin is warm and dry. No rash noted.  Psychiatric: He has a normal mood and affect. His behavior is normal. Judgment and thought content normal.  hard hearing  Lab Results  Component Value Date   WBC 5.6 06/08/2015   HGB 12.3* 06/08/2015   HCT 36.9*  06/08/2015   PLT 137.0* 06/08/2015   GLUCOSE 93 02/21/2015   CHOL 201* 02/21/2015   TRIG 176.0* 02/21/2015   HDL 38.10* 02/21/2015   LDLCALC 127* 02/21/2015   ALT 2 02/21/2015   AST 17 02/21/2015   NA 141 02/21/2015   K 4.8 02/21/2015   CL 105 02/21/2015   CREATININE 1.25 02/21/2015   BUN 32* 02/21/2015   CO2 31 02/21/2015   TSH 2.66 02/21/2015   PSA 1.20 02/21/2015    No  results found.  Assessment & Plan:   Diagnoses and all orders for this visit:  Essential hypertension  Dyslipidemia  Hearing loss d/t noise, bilateral  Tick bite  Other orders -     losartan (COZAAR) 100 MG tablet; Take 1 tablet (100 mg total) by mouth daily.  I have discontinued Mr. Milles's lisinopril. I am also having him start on losartan. Additionally, I am having him maintain his vitamin C, aspirin EC, Cholecalciferol, atorvastatin, and triamterene-hydrochlorothiazide.  Meds ordered this encounter  Medications  . losartan (COZAAR) 100 MG tablet    Sig: Take 1 tablet (100 mg total) by mouth daily.    Dispense:  90 tablet    Refill:  3     Follow-up: Return in about 4 months (around 01/05/2016) for a follow-up visit.  Walker Kehr, MD

## 2015-09-05 NOTE — Patient Instructions (Signed)
Normal BP<130/85 

## 2015-09-05 NOTE — Assessment & Plan Note (Signed)
Will observe No sx's Labs if needed

## 2015-09-05 NOTE — Assessment & Plan Note (Signed)
Worse: d/c Lisinopril. Start Losartan

## 2015-12-28 ENCOUNTER — Ambulatory Visit: Payer: Medicare Other | Admitting: Internal Medicine

## 2016-01-12 ENCOUNTER — Encounter: Payer: Self-pay | Admitting: Internal Medicine

## 2016-01-12 ENCOUNTER — Ambulatory Visit (INDEPENDENT_AMBULATORY_CARE_PROVIDER_SITE_OTHER): Payer: Medicare Other | Admitting: Internal Medicine

## 2016-01-12 VITALS — BP 158/80 | HR 70 | Wt 148.0 lb

## 2016-01-12 DIAGNOSIS — E785 Hyperlipidemia, unspecified: Secondary | ICD-10-CM

## 2016-01-12 DIAGNOSIS — N320 Bladder-neck obstruction: Secondary | ICD-10-CM

## 2016-01-12 DIAGNOSIS — Z0000 Encounter for general adult medical examination without abnormal findings: Secondary | ICD-10-CM

## 2016-01-12 DIAGNOSIS — I10 Essential (primary) hypertension: Secondary | ICD-10-CM | POA: Diagnosis not present

## 2016-01-12 NOTE — Progress Notes (Signed)
Subjective:  Patient ID: Joshua Adkins, male    DOB: 1936-03-31  Age: 80 y.o. MRN: [id number]  CC: No chief complaint on file.   HPI Joshua Adkins presents for HTN - BP is good at home, dyslipidemia f/up  Outpatient Prescriptions Prior to Visit  Medication Sig Dispense Refill  . Ascorbic Acid (VITAMIN C) 100 MG tablet Take 100 mg by mouth daily. Reported on 09/05/2015    . aspirin EC 81 MG tablet Take 81 mg by mouth daily.    Marland Kitchen atorvastatin (LIPITOR) 10 MG tablet Take 1 tablet (10 mg total) by mouth daily. 180 tablet 1  . Cholecalciferol 1000 UNITS tablet Take 1,000 Units by mouth every other day.    . losartan (COZAAR) 100 MG tablet Take 1 tablet (100 mg total) by mouth daily. 90 tablet 3  . triamterene-hydrochlorothiazide (MAXZIDE-25) 37.5-25 MG per tablet Take 0.5 tablets by mouth daily. 90 tablet 3   No facility-administered medications prior to visit.    ROS Review of Systems  Constitutional: Negative for appetite change, fatigue and unexpected weight change.  HENT: Negative for congestion, nosebleeds, sneezing, sore throat and trouble swallowing.   Eyes: Negative for itching and visual disturbance.  Respiratory: Negative for cough.   Cardiovascular: Negative for chest pain, palpitations and leg swelling.  Gastrointestinal: Negative for nausea, diarrhea, blood in stool and abdominal distention.  Genitourinary: Negative for frequency and hematuria.  Musculoskeletal: Negative for back pain, joint swelling, gait problem and neck pain.  Skin: Negative for rash.  Neurological: Negative for dizziness, tremors, speech difficulty and weakness.  Psychiatric/Behavioral: Negative for suicidal ideas, sleep disturbance, dysphoric mood and agitation. The patient is not nervous/anxious.     Objective:  BP 160/70 mmHg  Pulse 70  Wt 148 lb (67.132 kg)  SpO2 96%  BP Readings from Last 3 Encounters:  01/12/16 160/70  09/05/15 160/90  02/17/15 138/72    Wt Readings from Last 3  Encounters:  01/12/16 148 lb (67.132 kg)  09/05/15 152 lb (68.947 kg)  02/17/15 150 lb (68.04 kg)    Physical Exam  Constitutional: He is oriented to person, place, and time. He appears well-developed. No distress.  NAD  HENT:  Mouth/Throat: Oropharynx is clear and moist.  Eyes: Conjunctivae are normal. Pupils are equal, round, and reactive to light.  Neck: Normal range of motion. No JVD present. No thyromegaly present.  Cardiovascular: Normal rate, regular rhythm, normal heart sounds and intact distal pulses.  Exam reveals no gallop and no friction rub.   No murmur heard. Pulmonary/Chest: Effort normal and breath sounds normal. No respiratory distress. He has no wheezes. He has no rales. He exhibits no tenderness.  Abdominal: Soft. Bowel sounds are normal. He exhibits no distension and no mass. There is no tenderness. There is no rebound and no guarding.  Musculoskeletal: Normal range of motion. He exhibits no edema or tenderness.  Lymphadenopathy:    He has no cervical adenopathy.  Neurological: He is alert and oriented to person, place, and time. He has normal reflexes. No cranial nerve deficit. He exhibits normal muscle tone. He displays a negative Romberg sign. Coordination and gait normal.  Skin: Skin is warm and dry. No rash noted.  Psychiatric: He has a normal mood and affect. His behavior is normal. Judgment and thought content normal.    Lab Results  Component Value Date   WBC 5.6 06/08/2015   HGB 12.3* 06/08/2015   HCT 36.9* 06/08/2015   PLT 137.0* 06/08/2015   GLUCOSE  93 02/21/2015   CHOL 201* 02/21/2015   TRIG 176.0* 02/21/2015   HDL 38.10* 02/21/2015   LDLCALC 127* 02/21/2015   ALT 2 02/21/2015   AST 17 02/21/2015   NA 141 02/21/2015   K 4.8 02/21/2015   CL 105 02/21/2015   CREATININE 1.25 02/21/2015   BUN 32* 02/21/2015   CO2 31 02/21/2015   TSH 2.66 02/21/2015   PSA 1.20 02/21/2015    No results found.  Assessment & Plan:   There are no diagnoses  linked to this encounter. I am having Mr. Sada maintain his vitamin C, aspirin EC, Cholecalciferol, atorvastatin, triamterene-hydrochlorothiazide, and losartan.  No orders of the defined types were placed in this encounter.     Follow-up: No Follow-up on file.  Walker Kehr, MD

## 2016-01-12 NOTE — Progress Notes (Signed)
Pre visit review using our clinic review tool, if applicable. No additional management support is needed unless otherwise documented below in the visit note. 

## 2016-01-12 NOTE — Assessment & Plan Note (Addendum)
Maxzide, Losartan SBP at home 100-130

## 2016-01-12 NOTE — Assessment & Plan Note (Signed)
On Lipitor 

## 2016-03-22 ENCOUNTER — Other Ambulatory Visit: Payer: Self-pay | Admitting: Internal Medicine

## 2016-03-26 ENCOUNTER — Other Ambulatory Visit: Payer: Self-pay | Admitting: Internal Medicine

## 2016-04-12 ENCOUNTER — Ambulatory Visit (INDEPENDENT_AMBULATORY_CARE_PROVIDER_SITE_OTHER): Payer: Medicare Other | Admitting: Internal Medicine

## 2016-04-12 ENCOUNTER — Encounter: Payer: Self-pay | Admitting: Internal Medicine

## 2016-04-12 ENCOUNTER — Other Ambulatory Visit (INDEPENDENT_AMBULATORY_CARE_PROVIDER_SITE_OTHER): Payer: Medicare Other

## 2016-04-12 VITALS — BP 158/82 | HR 74 | Ht 69.0 in | Wt 148.0 lb

## 2016-04-12 DIAGNOSIS — N320 Bladder-neck obstruction: Secondary | ICD-10-CM | POA: Insufficient documentation

## 2016-04-12 DIAGNOSIS — R002 Palpitations: Secondary | ICD-10-CM | POA: Diagnosis not present

## 2016-04-12 DIAGNOSIS — E785 Hyperlipidemia, unspecified: Secondary | ICD-10-CM

## 2016-04-12 DIAGNOSIS — I10 Essential (primary) hypertension: Secondary | ICD-10-CM | POA: Diagnosis not present

## 2016-04-12 DIAGNOSIS — I499 Cardiac arrhythmia, unspecified: Secondary | ICD-10-CM | POA: Insufficient documentation

## 2016-04-12 DIAGNOSIS — Z0000 Encounter for general adult medical examination without abnormal findings: Secondary | ICD-10-CM

## 2016-04-12 DIAGNOSIS — H833X3 Noise effects on inner ear, bilateral: Secondary | ICD-10-CM

## 2016-04-12 LAB — BASIC METABOLIC PANEL
BUN: 26 mg/dL — AB (ref 6–23)
CHLORIDE: 106 meq/L (ref 96–112)
CO2: 32 meq/L (ref 19–32)
CREATININE: 1.02 mg/dL (ref 0.40–1.50)
Calcium: 9.5 mg/dL (ref 8.4–10.5)
GFR: 75 mL/min (ref >60.00)
GLUCOSE: 86 mg/dL (ref 70–99)
POTASSIUM: 3.9 meq/L (ref 3.5–5.1)
Sodium: 142 mEq/L (ref 135–145)

## 2016-04-12 LAB — HEPATIC FUNCTION PANEL
ALT: 3 U/L (ref 0–53)
AST: 18 U/L (ref 0–37)
Albumin: 4.4 g/dL (ref 3.5–5.2)
Alkaline Phosphatase: 44 U/L (ref 39–117)
BILIRUBIN DIRECT: 0.1 mg/dL (ref 0.0–0.3)
BILIRUBIN TOTAL: 0.5 mg/dL (ref 0.2–1.2)
Total Protein: 6.9 g/dL (ref 6.0–8.3)

## 2016-04-12 LAB — CBC WITH DIFFERENTIAL/PLATELET
BASOS PCT: 0.5 % (ref 0.0–3.0)
Basophils Absolute: 0 10*3/uL (ref 0.0–0.1)
EOS ABS: 0 10*3/uL (ref 0.0–0.7)
EOS PCT: 0 % (ref 0.0–5.0)
HCT: 33.9 % — ABNORMAL LOW (ref 39.0–52.0)
HEMOGLOBIN: 11.5 g/dL — AB (ref 13.0–17.0)
LYMPHS ABS: 1.5 10*3/uL (ref 0.7–4.0)
Lymphocytes Relative: 35.5 % (ref 12.0–46.0)
MCHC: 34 g/dL (ref 30.0–36.0)
MCV: 85.2 fl (ref 78.0–100.0)
MONO ABS: 0.4 10*3/uL (ref 0.1–1.0)
Monocytes Relative: 10.1 % (ref 3.0–12.0)
NEUTROS ABS: 2.3 10*3/uL (ref 1.4–7.7)
Neutrophils Relative %: 53.9 % (ref 43.0–77.0)
PLATELETS: 119 10*3/uL — AB (ref 150.0–400.0)
RBC: 3.98 Mil/uL — ABNORMAL LOW (ref 4.22–5.81)
RDW: 14.6 % (ref 11.5–15.5)
WBC: 4.3 10*3/uL (ref 4.0–10.5)

## 2016-04-12 LAB — LIPID PANEL
CHOL/HDL RATIO: 3
Cholesterol: 141 mg/dL (ref 0–200)
HDL: 52.4 mg/dL (ref >39.00)
LDL Cholesterol: 75 mg/dL (ref 0–99)
NONHDL: 88.55
Triglycerides: 68 mg/dL (ref 0.0–149.0)
VLDL: 13.6 mg/dL (ref 0.0–40.0)

## 2016-04-12 LAB — URINALYSIS
BILIRUBIN URINE: NEGATIVE
HGB URINE DIPSTICK: NEGATIVE
Ketones, ur: NEGATIVE
Leukocytes, UA: NEGATIVE
NITRITE: NEGATIVE
Specific Gravity, Urine: <=1.005 — AB (ref 1.000–1.030)
TOTAL PROTEIN, URINE-UPE24: NEGATIVE
URINE GLUCOSE: NEGATIVE
UROBILINOGEN UA: 0.2 (ref 0.0–1.0)
pH: 7 (ref 5.0–8.0)

## 2016-04-12 LAB — PSA: PSA: 1.63 ng/mL (ref 0.10–4.00)

## 2016-04-12 LAB — TSH: TSH: 1.95 u[IU]/mL (ref 0.35–4.50)

## 2016-04-12 NOTE — Assessment & Plan Note (Signed)
Lipitor 

## 2016-04-12 NOTE — Assessment & Plan Note (Signed)
Possible PACs; EKG is nl

## 2016-04-12 NOTE — Assessment & Plan Note (Signed)
Worse: elevated BP - ran out of Maxzide 3 weeks ago. Re-start Maxzide

## 2016-04-12 NOTE — Progress Notes (Signed)
Pre visit review using our clinic review tool, if applicable. No additional management support is needed unless otherwise documented below in the visit note. 

## 2016-04-12 NOTE — Assessment & Plan Note (Signed)
Prostate exam WNL PSA Medical options reviewed including stopping a diuretic, saw palmetto

## 2016-04-12 NOTE — Assessment & Plan Note (Signed)
The pt was advised to see a specialist

## 2016-04-12 NOTE — Progress Notes (Signed)
Subjective:  Patient ID: Joshua Adkins, male    DOB: 1935-09-11  Age: 80 y.o. MRN: [id number]  CC: No chief complaint on file.   HPI Joshua Adkins presents for a well exam. C/o elevated BP - ran out of Maxzide 3 weeks ago.Marland KitchenMarland KitchenC/o occ dribbling when the bladder is full...  Outpatient Medications Prior to Visit  Medication Sig Dispense Refill  . Ascorbic Acid (VITAMIN C) 100 MG tablet Take 100 mg by mouth daily. Reported on 09/05/2015    . aspirin EC 81 MG tablet Take 81 mg by mouth daily.    Marland Kitchen atorvastatin (LIPITOR) 10 MG tablet TAKE ONE TABLET BY MOUTH EVERY DAY 180 tablet 1  . Cholecalciferol 1000 UNITS tablet Take 1,000 Units by mouth every other day.    . losartan (COZAAR) 100 MG tablet Take 1 tablet (100 mg total) by mouth daily. 90 tablet 3  . triamterene-hydrochlorothiazide (MAXZIDE-25) 37.5-25 MG tablet TAKE ONE-HALF TABLET BY MOUTH ONCE DAILY 45 tablet 3   No facility-administered medications prior to visit.     ROS Review of Systems  Constitutional: Negative for appetite change, fatigue and unexpected weight change.  HENT: Positive for hearing loss. Negative for congestion, ear discharge, nosebleeds, sneezing, sore throat and trouble swallowing.   Eyes: Negative for itching and visual disturbance.  Respiratory: Negative for cough.   Cardiovascular: Negative for chest pain, palpitations and leg swelling.  Gastrointestinal: Negative for abdominal distention, blood in stool, diarrhea and nausea.  Genitourinary: Positive for urgency. Negative for frequency and hematuria.  Musculoskeletal: Negative for back pain, gait problem, joint swelling and neck pain.  Skin: Negative for rash.  Neurological: Negative for dizziness, tremors, speech difficulty and weakness.  Psychiatric/Behavioral: Negative for agitation, dysphoric mood and sleep disturbance. The patient is not nervous/anxious.     Objective:  BP (!) 158/66   Pulse 74   Ht 5\' 9"  (1.753 m)   Wt 148 lb (67.1 kg)    SpO2 96%   BMI 21.86 kg/m   BP Readings from Last 3 Encounters:  04/12/16 (!) 158/66  01/12/16 (!) 158/80  09/05/15 (!) 160/90    Wt Readings from Last 3 Encounters:  04/12/16 148 lb (67.1 kg)  01/12/16 148 lb (67.1 kg)  09/05/15 152 lb (68.9 kg)    Physical Exam  Constitutional: He is oriented to person, place, and time. He appears well-developed. No distress.  NAD  HENT:  Mouth/Throat: Oropharynx is clear and moist.  Eyes: Conjunctivae are normal. Pupils are equal, round, and reactive to light.  Neck: Normal range of motion. No JVD present. No thyromegaly present.  Cardiovascular: Normal rate, regular rhythm, normal heart sounds and intact distal pulses.  Exam reveals no gallop and no friction rub.   No murmur heard. Pulmonary/Chest: Effort normal and breath sounds normal. No respiratory distress. He has no wheezes. He has no rales. He exhibits no tenderness.  Abdominal: Soft. Bowel sounds are normal. He exhibits no distension and no mass. There is no tenderness. There is no rebound and no guarding.  Genitourinary: Rectum normal and prostate normal. Rectal exam shows guaiac negative stool.  Musculoskeletal: Normal range of motion. He exhibits no edema or tenderness.  Lymphadenopathy:    He has no cervical adenopathy.  Neurological: He is alert and oriented to person, place, and time. He has normal reflexes. No cranial nerve deficit. He exhibits normal muscle tone. He displays a negative Romberg sign. Coordination and gait normal.  Skin: Skin is warm and dry. No rash noted.  Psychiatric: He has a normal mood and affect. His behavior is normal. Judgment and thought content normal.  occ irreg heart beats Prostate - WNL  Lab Results  Component Value Date   WBC 5.6 06/08/2015   HGB 12.3 (L) 06/08/2015   HCT 36.9 (L) 06/08/2015   PLT 137.0 (L) 06/08/2015   GLUCOSE 93 02/21/2015   CHOL 201 (H) 02/21/2015   TRIG 176.0 (H) 02/21/2015   HDL 38.10 (L) 02/21/2015   LDLCALC 127  (H) 02/21/2015   ALT 2 02/21/2015   AST 17 02/21/2015   NA 141 02/21/2015   K 4.8 02/21/2015   CL 105 02/21/2015   CREATININE 1.25 02/21/2015   BUN 32 (H) 02/21/2015   CO2 31 02/21/2015   TSH 2.66 02/21/2015   PSA 1.20 02/21/2015   Procedure: EKG Indication: irreg beats Impression: NSR. No acute changes.  No results found.  Assessment & Plan:   There are no diagnoses linked to this encounter. I am having Mr. Santmyer maintain his vitamin C, aspirin EC, Cholecalciferol, losartan, triamterene-hydrochlorothiazide, and atorvastatin.  No orders of the defined types were placed in this encounter.    Follow-up: No Follow-up on file.  Walker Kehr, MD

## 2016-04-12 NOTE — Assessment & Plan Note (Addendum)
Here for medicare wellness/physical  Diet: heart healthy  Physical activity: not sedentary  Depression/mood screen: negative  Hearing: decreased a little Visual acuity: grossly normal, performs annual eye exam  ADLs: capable  Fall risk: none  Home safety: good  Cognitive evaluation: intact to orientation, naming, recall and repetition  EOL planning: adv directives, full code/ I agree  I have personally reviewed and have noted  1. The patient's medical, surgical and social history  2. Their use of alcohol, tobacco or illicit drugs  3. Their current medications and supplements  4. The patient's functional ability including ADL's, fall risks, home safety risks and hearing or visual impairment.  5. Diet and physical activities  6. Evidence for depression or mood disorders 7. The roster of all physicians providing medical care to patient - is listed in the Snapshot section of the chart and reviewed today.    Today patient counseled on age appropriate routine health concerns for screening and prevention, each reviewed and up to date or declined. Immunizations reviewed and up to date or declined. Labs ordered and reviewed. Risk factors for depression reviewed and negative. Hearing function and visual acuity are intact. ADLs screened and addressed as needed. Functional ability and level of safety reviewed and appropriate. Education, counseling and referrals performed based on assessed risks today. Patient provided with a copy of personalized plan for preventive services.  The pt was advised to see an eye and ear specialists [facility] Dr Paulita Fujita ?2013 Pt declined a flu shot

## 2016-04-12 NOTE — Patient Instructions (Signed)

## 2016-10-23 DIAGNOSIS — L821 Other seborrheic keratosis: Secondary | ICD-10-CM | POA: Diagnosis not present

## 2016-10-23 DIAGNOSIS — D485 Neoplasm of uncertain behavior of skin: Secondary | ICD-10-CM | POA: Diagnosis not present

## 2016-10-23 DIAGNOSIS — Z85828 Personal history of other malignant neoplasm of skin: Secondary | ICD-10-CM | POA: Diagnosis not present

## 2016-10-23 DIAGNOSIS — L820 Inflamed seborrheic keratosis: Secondary | ICD-10-CM | POA: Diagnosis not present

## 2016-10-23 DIAGNOSIS — D692 Other nonthrombocytopenic purpura: Secondary | ICD-10-CM | POA: Diagnosis not present

## 2016-10-23 DIAGNOSIS — D1801 Hemangioma of skin and subcutaneous tissue: Secondary | ICD-10-CM | POA: Diagnosis not present

## 2016-10-23 DIAGNOSIS — L570 Actinic keratosis: Secondary | ICD-10-CM | POA: Diagnosis not present

## 2016-12-11 ENCOUNTER — Other Ambulatory Visit: Payer: Self-pay | Admitting: Internal Medicine

## 2017-03-26 ENCOUNTER — Other Ambulatory Visit: Payer: Self-pay | Admitting: Internal Medicine

## 2017-04-25 ENCOUNTER — Ambulatory Visit (INDEPENDENT_AMBULATORY_CARE_PROVIDER_SITE_OTHER): Payer: Medicare HMO | Admitting: Internal Medicine

## 2017-04-25 ENCOUNTER — Encounter: Payer: Self-pay | Admitting: Internal Medicine

## 2017-04-25 ENCOUNTER — Other Ambulatory Visit (INDEPENDENT_AMBULATORY_CARE_PROVIDER_SITE_OTHER): Payer: Medicare HMO

## 2017-04-25 VITALS — BP 162/70 | HR 65 | Temp 98.1°F | Ht 69.0 in | Wt 142.0 lb

## 2017-04-25 DIAGNOSIS — Z0000 Encounter for general adult medical examination without abnormal findings: Secondary | ICD-10-CM

## 2017-04-25 DIAGNOSIS — Z634 Disappearance and death of family member: Secondary | ICD-10-CM

## 2017-04-25 DIAGNOSIS — F4321 Adjustment disorder with depressed mood: Secondary | ICD-10-CM | POA: Insufficient documentation

## 2017-04-25 DIAGNOSIS — I10 Essential (primary) hypertension: Secondary | ICD-10-CM

## 2017-04-25 DIAGNOSIS — E785 Hyperlipidemia, unspecified: Secondary | ICD-10-CM

## 2017-04-25 LAB — URINALYSIS
BILIRUBIN URINE: NEGATIVE
Hgb urine dipstick: NEGATIVE
Ketones, ur: NEGATIVE
LEUKOCYTES UA: NEGATIVE
NITRITE: NEGATIVE
PH: 7.5 (ref 5.0–8.0)
SPECIFIC GRAVITY, URINE: 1.01 (ref 1.000–1.030)
TOTAL PROTEIN, URINE-UPE24: NEGATIVE
Urine Glucose: NEGATIVE
Urobilinogen, UA: 0.2 (ref 0.0–1.0)

## 2017-04-25 LAB — LIPID PANEL
CHOL/HDL RATIO: 2
Cholesterol: 138 mg/dL (ref 0–200)
HDL: 55.4 mg/dL (ref >39.00)
LDL CALC: 69 mg/dL (ref 0–99)
NONHDL: 82.24
Triglycerides: 64 mg/dL (ref 0.0–149.0)
VLDL: 12.8 mg/dL (ref 0.0–40.0)

## 2017-04-25 LAB — BASIC METABOLIC PANEL
BUN: 30 mg/dL — AB (ref 6–23)
CALCIUM: 9.6 mg/dL (ref 8.4–10.5)
CO2: 31 mEq/L (ref 19–32)
CREATININE: 1.06 mg/dL (ref 0.40–1.50)
Chloride: 103 mEq/L (ref 96–112)
GFR: 71.55 mL/min (ref >60.00)
Glucose, Bld: 96 mg/dL (ref 70–99)
Potassium: 4.3 mEq/L (ref 3.5–5.1)
Sodium: 141 mEq/L (ref 135–145)

## 2017-04-25 LAB — CBC WITH DIFFERENTIAL/PLATELET
BASOS ABS: 0 10*3/uL (ref 0.0–0.1)
BASOS PCT: 0.5 % (ref 0.0–3.0)
EOS PCT: 0 % (ref 0.0–5.0)
Eosinophils Absolute: 0 10*3/uL (ref 0.0–0.7)
HEMATOCRIT: 37.3 % — AB (ref 39.0–52.0)
Hemoglobin: 12.4 g/dL — ABNORMAL LOW (ref 13.0–17.0)
LYMPHS ABS: 2 10*3/uL (ref 0.7–4.0)
LYMPHS PCT: 41.7 % (ref 12.0–46.0)
MCHC: 33.2 g/dL (ref 30.0–36.0)
MCV: 88.2 fl (ref 78.0–100.0)
MONOS PCT: 10 % (ref 3.0–12.0)
Monocytes Absolute: 0.5 10*3/uL (ref 0.1–1.0)
NEUTROS ABS: 2.3 10*3/uL (ref 1.4–7.7)
NEUTROS PCT: 47.8 % (ref 43.0–77.0)
PLATELETS: 112 10*3/uL — AB (ref 150.0–400.0)
RBC: 4.23 Mil/uL (ref 4.22–5.81)
RDW: 14.5 % (ref 11.5–15.5)
WBC: 4.8 10*3/uL (ref 4.0–10.5)

## 2017-04-25 LAB — HEPATIC FUNCTION PANEL
ALT: 2 U/L (ref 0–53)
AST: 17 U/L (ref 0–37)
Albumin: 4.3 g/dL (ref 3.5–5.2)
Alkaline Phosphatase: 42 U/L (ref 39–117)
BILIRUBIN DIRECT: 0.2 mg/dL (ref 0.0–0.3)
BILIRUBIN TOTAL: 0.8 mg/dL (ref 0.2–1.2)
Total Protein: 7 g/dL (ref 6.0–8.3)

## 2017-04-25 LAB — PSA: PSA: 1.35 ng/mL (ref 0.10–4.00)

## 2017-04-25 LAB — TSH: TSH: 2.67 u[IU]/mL (ref 0.35–4.50)

## 2017-04-25 MED ORDER — LOSARTAN POTASSIUM 100 MG PO TABS: 100.0000 mg | ORAL_TABLET | Freq: Every day | ORAL | 2 refills | Status: DC

## 2017-04-25 MED ORDER — TRIAMTERENE-HCTZ 37.5-25 MG PO TABS: 1.0000 | ORAL_TABLET | Freq: Every day | ORAL | 2 refills | Status: DC

## 2017-04-25 MED ORDER — ATORVASTATIN CALCIUM 10 MG PO TABS: 10.0000 mg | ORAL_TABLET | Freq: Every day | ORAL | 2 refills | Status: DC

## 2017-04-25 NOTE — Assessment & Plan Note (Signed)
another daughter died in 7/18, wife has dementia Joshua Adkins (dtr) died in 10/17/14

## 2017-04-25 NOTE — Assessment & Plan Note (Addendum)
SBP ok at home Maxzide - dose increased, Losartan

## 2017-04-25 NOTE — Assessment & Plan Note (Signed)
Lipitor 

## 2017-04-25 NOTE — Progress Notes (Signed)
Subjective:  Patient ID: Joshua Adkins, male    DOB: 12-01-1935  Age: 81 y.o. MRN: [id number]  CC: No chief complaint on file.   HPI Joshua Adkins presents for a well exam F/u HTN Younger dtr died in 2023-02-11  Outpatient Medications Prior to Visit  Medication Sig Dispense Refill  . Ascorbic Acid (VITAMIN C) 1000 MG tablet Take 1,000 mg by mouth every other day. Reported on 09/05/2015    . aspirin EC 81 MG tablet Take 81 mg by mouth daily.    Marland Kitchen atorvastatin (LIPITOR) 10 MG tablet Take 1 tablet (10 mg total) by mouth daily. Annual appt is due in must see provider for future refills 30 tablet 0  . Cholecalciferol 1000 UNITS tablet Take 1,000 Units by mouth every other day.    . losartan (COZAAR) 100 MG tablet TAKE ONE TABLET BY MOUTH ONCE DAILY 90 tablet 1  . triamterene-hydrochlorothiazide (MAXZIDE-25) 37.5-25 MG tablet TAKE ONE-HALF TABLET BY MOUTH ONCE DAILY 45 tablet 3   No facility-administered medications prior to visit.     ROS Review of Systems  Constitutional: Negative for appetite change, fatigue and unexpected weight change.  HENT: Negative for congestion, nosebleeds, sneezing, sore throat and trouble swallowing.   Eyes: Negative for itching and visual disturbance.  Respiratory: Negative for cough.   Cardiovascular: Negative for chest pain, palpitations and leg swelling.  Gastrointestinal: Negative for abdominal distention, blood in stool, diarrhea and nausea.  Genitourinary: Negative for frequency and hematuria.  Musculoskeletal: Negative for back pain, gait problem, joint swelling and neck pain.  Skin: Negative for rash.  Neurological: Negative for dizziness, tremors, speech difficulty and weakness.  Psychiatric/Behavioral: Negative for agitation, dysphoric mood and sleep disturbance. The patient is not nervous/anxious.     Objective:  BP (!) 162/70 (BP Location: Left Arm, Patient Position: Sitting, Cuff Size: Normal)   Pulse 65   Temp 98.1 F (36.7 C) (Oral)    Ht 5\' 9"  (1.753 m)   Wt 142 lb (64.4 kg)   SpO2 99%   BMI 20.97 kg/m   BP Readings from Last 3 Encounters:  04/25/17 (!) 162/70  04/12/16 (!) 158/82  01/12/16 (!) 158/80    Wt Readings from Last 3 Encounters:  04/25/17 142 lb (64.4 kg)  04/12/16 148 lb (67.1 kg)  01/12/16 148 lb (67.1 kg)    Physical Exam  Constitutional: He is oriented to person, place, and time. He appears well-developed. No distress.  NAD  HENT:  Mouth/Throat: Oropharynx is clear and moist.  Eyes: Pupils are equal, round, and reactive to light. Conjunctivae are normal.  Neck: Normal range of motion. No JVD present. No thyromegaly present.  Cardiovascular: Normal rate, regular rhythm, normal heart sounds and intact distal pulses.  Exam reveals no gallop and no friction rub.   No murmur heard. Pulmonary/Chest: Effort normal and breath sounds normal. No respiratory distress. He has no wheezes. He has no rales. He exhibits no tenderness.  Abdominal: Soft. Bowel sounds are normal. He exhibits no distension and no mass. There is no tenderness. There is no rebound and no guarding.  Genitourinary: Rectum normal and prostate normal. Rectal exam shows guaiac negative stool.  Musculoskeletal: Normal range of motion. He exhibits no edema or tenderness.  Lymphadenopathy:    He has no cervical adenopathy.  Neurological: He is alert and oriented to person, place, and time. He has normal reflexes. No cranial nerve deficit. He exhibits normal muscle tone. He displays a negative Romberg sign. Coordination and gait  normal.  Skin: Skin is warm and dry. No rash noted.  Psychiatric: He has a normal mood and affect. His behavior is normal. Judgment and thought content normal.  prostate 1+ Sad kyphosis  Lab Results  Component Value Date   WBC 4.3 04/12/2016   HGB 11.5 (L) 04/12/2016   HCT 33.9 (L) 04/12/2016   PLT 119.0 (L) 04/12/2016   GLUCOSE 86 04/12/2016   CHOL 141 04/12/2016   TRIG 68.0 04/12/2016   HDL 52.40  04/12/2016   LDLCALC 75 04/12/2016   ALT 3 04/12/2016   AST 18 04/12/2016   NA 142 04/12/2016   K 3.9 04/12/2016   CL 106 04/12/2016   CREATININE 1.02 04/12/2016   BUN 26 (H) 04/12/2016   CO2 32 04/12/2016   TSH 1.95 04/12/2016   PSA 1.63 04/12/2016    No results found.  Assessment & Plan:   There are no diagnoses linked to this encounter. I am having Mr. Breeding maintain his vitamin C, aspirin EC, Cholecalciferol, triamterene-hydrochlorothiazide, losartan, and atorvastatin.  No orders of the defined types were placed in this encounter.    Follow-up: No Follow-up on file.  Walker Kehr, MD

## 2017-04-25 NOTE — Assessment & Plan Note (Signed)
Here for medicare wellness/physical  Diet: heart healthy  Physical activity: not sedentary  Depression/mood screen: grieving  Hearing: decreased to whispered voice  Visual acuity: grossly normal w/glasses, performs annual eye exam  ADLs: capable  Fall risk: low to none  Home safety: good  Cognitive evaluation: intact to orientation, naming, recall and repetition  EOL planning: adv directives, full code/ I agree  I have personally reviewed and have noted  1. The patient's medical, surgical and social history  2. Their use of alcohol, tobacco or illicit drugs  3. Their current medications and supplements  4. The patient's functional ability including ADL's, fall risks, home safety risks and hearing or visual impairment.  5. Diet and physical activities  6. Evidence for depression or mood disorders - grieving 7. The roster of all physicians providing medical care to patient - is listed in the Snapshot section of the chart and reviewed today.    Today patient counseled on age appropriate routine health concerns for screening and prevention, each reviewed and up to date or declined. Immunizations reviewed and up to date or declined. Labs ordered and reviewed. Risk factors for depression reviewed and negative. Hearing function and visual acuity are intact. ADLs screened and addressed as needed. Functional ability and level of safety reviewed and appropriate. Education, counseling and referrals performed based on assessed risks today. Patient provided with a copy of personalized plan for preventive services.   The pt was advised to see an eye and ear specialists [facility] Dr Paulita Fujita ?2013 Pt declined a flu shot

## 2017-04-25 NOTE — Patient Instructions (Signed)

## 2017-09-26 DIAGNOSIS — I10 Essential (primary) hypertension: Secondary | ICD-10-CM | POA: Diagnosis not present

## 2017-09-26 DIAGNOSIS — R69 Illness, unspecified: Secondary | ICD-10-CM | POA: Diagnosis not present

## 2017-09-26 DIAGNOSIS — E785 Hyperlipidemia, unspecified: Secondary | ICD-10-CM | POA: Diagnosis not present

## 2017-10-23 DIAGNOSIS — L245 Irritant contact dermatitis due to other chemical products: Secondary | ICD-10-CM | POA: Diagnosis not present

## 2017-10-23 DIAGNOSIS — Z85828 Personal history of other malignant neoplasm of skin: Secondary | ICD-10-CM | POA: Diagnosis not present

## 2017-10-23 DIAGNOSIS — D692 Other nonthrombocytopenic purpura: Secondary | ICD-10-CM | POA: Diagnosis not present

## 2017-10-23 DIAGNOSIS — L821 Other seborrheic keratosis: Secondary | ICD-10-CM | POA: Diagnosis not present

## 2017-10-23 DIAGNOSIS — D0462 Carcinoma in situ of skin of left upper limb, including shoulder: Secondary | ICD-10-CM | POA: Diagnosis not present

## 2018-01-18 ENCOUNTER — Other Ambulatory Visit: Payer: Self-pay | Admitting: Internal Medicine

## 2018-04-07 ENCOUNTER — Other Ambulatory Visit: Payer: Self-pay

## 2018-04-07 MED ORDER — LOSARTAN POTASSIUM 100 MG PO TABS: 100.0000 mg | ORAL_TABLET | Freq: Every day | ORAL | 2 refills | Status: DC

## 2018-04-27 ENCOUNTER — Other Ambulatory Visit: Payer: Self-pay | Admitting: Internal Medicine

## 2018-05-07 ENCOUNTER — Other Ambulatory Visit (INDEPENDENT_AMBULATORY_CARE_PROVIDER_SITE_OTHER): Payer: Medicare HMO

## 2018-05-07 ENCOUNTER — Ambulatory Visit (INDEPENDENT_AMBULATORY_CARE_PROVIDER_SITE_OTHER): Payer: Medicare HMO | Admitting: Internal Medicine

## 2018-05-07 ENCOUNTER — Encounter: Payer: Self-pay | Admitting: Internal Medicine

## 2018-05-07 VITALS — BP 158/74 | HR 56 | Temp 98.4°F | Ht 69.0 in | Wt 145.0 lb

## 2018-05-07 DIAGNOSIS — Z0000 Encounter for general adult medical examination without abnormal findings: Secondary | ICD-10-CM

## 2018-05-07 DIAGNOSIS — I10 Essential (primary) hypertension: Secondary | ICD-10-CM

## 2018-05-07 DIAGNOSIS — H833X3 Noise effects on inner ear, bilateral: Secondary | ICD-10-CM

## 2018-05-07 DIAGNOSIS — E785 Hyperlipidemia, unspecified: Secondary | ICD-10-CM

## 2018-05-07 DIAGNOSIS — N320 Bladder-neck obstruction: Secondary | ICD-10-CM

## 2018-05-07 LAB — LIPID PANEL
CHOL/HDL RATIO: 3
Cholesterol: 146 mg/dL (ref 0–200)
HDL: 56.8 mg/dL (ref >39.00)
LDL CALC: 72 mg/dL (ref 0–99)
NonHDL: 89.3
TRIGLYCERIDES: 85 mg/dL (ref 0.0–149.0)
VLDL: 17 mg/dL (ref 0.0–40.0)

## 2018-05-07 LAB — URINALYSIS
Bilirubin Urine: NEGATIVE
Hgb urine dipstick: NEGATIVE
Ketones, ur: NEGATIVE
LEUKOCYTES UA: NEGATIVE
NITRITE: NEGATIVE
PH: 7 (ref 5.0–8.0)
SPECIFIC GRAVITY, URINE: 1.01 (ref 1.000–1.030)
Total Protein, Urine: NEGATIVE
UROBILINOGEN UA: 0.2 (ref 0.0–1.0)
Urine Glucose: NEGATIVE

## 2018-05-07 LAB — HEPATIC FUNCTION PANEL
ALT: 2 U/L (ref 0–53)
AST: 18 U/L (ref 0–37)
Albumin: 4.6 g/dL (ref 3.5–5.2)
Alkaline Phosphatase: 50 U/L (ref 39–117)
Bilirubin, Direct: 0.1 mg/dL (ref 0.0–0.3)
Total Bilirubin: 0.5 mg/dL (ref 0.2–1.2)
Total Protein: 7.2 g/dL (ref 6.0–8.3)

## 2018-05-07 LAB — CBC WITH DIFFERENTIAL/PLATELET
BASOS ABS: 0 10*3/uL (ref 0.0–0.1)
Basophils Relative: 0.4 % (ref 0.0–3.0)
EOS ABS: 0 10*3/uL (ref 0.0–0.7)
Eosinophils Relative: 0 % (ref 0.0–5.0)
HEMATOCRIT: 35.4 % — AB (ref 39.0–52.0)
HEMOGLOBIN: 12 g/dL — AB (ref 13.0–17.0)
Lymphocytes Relative: 35 % (ref 12.0–46.0)
Lymphs Abs: 1.5 10*3/uL (ref 0.7–4.0)
MCHC: 33.7 g/dL (ref 30.0–36.0)
MCV: 87.6 fl (ref 78.0–100.0)
MONOS PCT: 9.2 % (ref 3.0–12.0)
Monocytes Absolute: 0.4 10*3/uL (ref 0.1–1.0)
Neutro Abs: 2.4 10*3/uL (ref 1.4–7.7)
Neutrophils Relative %: 55.4 % (ref 43.0–77.0)
Platelets: 130 10*3/uL — ABNORMAL LOW (ref 150.0–400.0)
RBC: 4.04 Mil/uL — AB (ref 4.22–5.81)
RDW: 14.8 % (ref 11.5–15.5)
WBC: 4.4 10*3/uL (ref 4.0–10.5)

## 2018-05-07 LAB — BASIC METABOLIC PANEL
BUN: 31 mg/dL — ABNORMAL HIGH (ref 6–23)
CALCIUM: 9.7 mg/dL (ref 8.4–10.5)
CHLORIDE: 105 meq/L (ref 96–112)
CO2: 27 meq/L (ref 19–32)
Creatinine, Ser: 1.14 mg/dL (ref 0.40–1.50)
GFR: 65.62 mL/min (ref >60.00)
Glucose, Bld: 98 mg/dL (ref 70–99)
Potassium: 4.3 mEq/L (ref 3.5–5.1)
SODIUM: 143 meq/L (ref 135–145)

## 2018-05-07 LAB — PSA: PSA: 1.94 ng/mL (ref 0.10–4.00)

## 2018-05-07 LAB — TSH: TSH: 2.71 u[IU]/mL (ref 0.35–4.50)

## 2018-05-07 MED ORDER — ATORVASTATIN CALCIUM 10 MG PO TABS: 10.0000 mg | ORAL_TABLET | Freq: Every day | ORAL | 3 refills | Status: DC

## 2018-05-07 MED ORDER — CARVEDILOL 25 MG PO TABS: 25.0000 mg | ORAL_TABLET | Freq: Two times a day (BID) | ORAL | 3 refills | Status: DC

## 2018-05-07 MED ORDER — LOSARTAN POTASSIUM 100 MG PO TABS: 100.0000 mg | ORAL_TABLET | Freq: Every day | ORAL | 3 refills | Status: DC

## 2018-05-07 NOTE — Progress Notes (Signed)
Subjective:  Patient ID: Joshua Adkins, male    DOB: 1936-05-04  Age: 82 y.o. MRN: [id number]  CC: No chief complaint on file.   HPI JEARLD HEMP presents for a well exam C/o urinary urgency, frequency Refused a flu shot  Outpatient Medications Prior to Visit  Medication Sig Dispense Refill  . Ascorbic Acid (VITAMIN C) 1000 MG tablet Take 1,000 mg by mouth every other day. Reported on 09/05/2015    . aspirin EC 81 MG tablet Take 81 mg by mouth daily.    Marland Kitchen atorvastatin (LIPITOR) 10 MG tablet TAKE 1 TABLET BY MOUTH ONCE DAILY 90 tablet 3  . Cholecalciferol 1000 UNITS tablet Take 1,000 Units by mouth every other day.    . losartan (COZAAR) 100 MG tablet Take 1 tablet (100 mg total) by mouth daily. 90 tablet 2  . triamterene-hydrochlorothiazide (MAXZIDE-25) 37.5-25 MG tablet Take 1 tablet by mouth daily. 90 tablet 2   No facility-administered medications prior to visit.     ROS: Review of Systems  Constitutional: Negative for appetite change, fatigue and unexpected weight change.  HENT: Positive for hearing loss. Negative for congestion, nosebleeds, sneezing, sore throat and trouble swallowing.   Eyes: Negative for itching and visual disturbance.  Respiratory: Negative for cough.   Cardiovascular: Negative for chest pain, palpitations and leg swelling.  Gastrointestinal: Negative for abdominal distention, blood in stool, diarrhea and nausea.  Genitourinary: Positive for frequency and urgency. Negative for hematuria.  Musculoskeletal: Negative for back pain, gait problem, joint swelling and neck pain.  Skin: Negative for rash.  Neurological: Negative for dizziness, tremors, speech difficulty and weakness.  Psychiatric/Behavioral: Negative for agitation, dysphoric mood, sleep disturbance and suicidal ideas. The patient is not nervous/anxious.     Objective:  BP (!) 158/74 (BP Location: Left Arm, Patient Position: Sitting, Cuff Size: Normal)   Pulse (!) 56   Temp 98.4 F (36.9  C) (Oral)   Ht 5\' 9"  (1.753 m)   Wt 145 lb (65.8 kg)   SpO2 98%   BMI 21.41 kg/m   BP Readings from Last 3 Encounters:  05/07/18 (!) 158/74  04/25/17 (!) 162/70  04/12/16 (!) 158/82    Wt Readings from Last 3 Encounters:  05/07/18 145 lb (65.8 kg)  04/25/17 142 lb (64.4 kg)  04/12/16 148 lb (67.1 kg)    Physical Exam  Constitutional: He is oriented to person, place, and time. He appears well-developed. No distress.  NAD  HENT:  Mouth/Throat: Oropharynx is clear and moist.  Eyes: Pupils are equal, round, and reactive to light. Conjunctivae are normal.  Neck: Normal range of motion. No JVD present. No thyromegaly present.  Cardiovascular: Normal rate, regular rhythm, normal heart sounds and intact distal pulses. Exam reveals no gallop and no friction rub.  No murmur heard. Pulmonary/Chest: Effort normal and breath sounds normal. No respiratory distress. He has no wheezes. He has no rales. He exhibits no tenderness.  Abdominal: Soft. Bowel sounds are normal. He exhibits no distension and no mass. There is no tenderness. There is no rebound and no guarding.  Musculoskeletal: Normal range of motion. He exhibits no edema or tenderness.  Lymphadenopathy:    He has no cervical adenopathy.  Neurological: He is alert and oriented to person, place, and time. He has normal reflexes. No cranial nerve deficit. He exhibits normal muscle tone. He displays a negative Romberg sign. Coordination and gait normal.  Skin: Skin is warm and dry. No rash noted.  Psychiatric: He has a  normal mood and affect. His behavior is normal. Judgment and thought content normal.    Lab Results  Component Value Date   WBC 4.8 04/25/2017   HGB 12.4 (L) 04/25/2017   HCT 37.3 (L) 04/25/2017   PLT 112.0 (L) 04/25/2017   GLUCOSE 96 04/25/2017   CHOL 138 04/25/2017   TRIG 64.0 04/25/2017   HDL 55.40 04/25/2017   LDLCALC 69 04/25/2017   ALT 2 04/25/2017   AST 17 04/25/2017   NA 141 04/25/2017   K 4.3  04/25/2017   CL 103 04/25/2017   CREATININE 1.06 04/25/2017   BUN 30 (H) 04/25/2017   CO2 31 04/25/2017   TSH 2.67 04/25/2017   PSA 1.35 04/25/2017    No results found.  Assessment & Plan:   There are no diagnoses linked to this encounter.   No orders of the defined types were placed in this encounter.    Follow-up: No follow-ups on file.  Walker Kehr, MD

## 2018-05-07 NOTE — Assessment & Plan Note (Signed)
Maxzide - d/c due to urgency

## 2018-05-07 NOTE — Assessment & Plan Note (Signed)
Audiology ref suggested

## 2018-05-07 NOTE — Assessment & Plan Note (Signed)
Maxzide d/c due to urgency Losartan Add Coreg

## 2018-05-07 NOTE — Assessment & Plan Note (Signed)
Here for medicare wellness/physical  Diet: heart healthy  Physical activity: not sedentary  Depression/mood screen: grieving  Hearing: decreased to whispered voice  Visual acuity: grossly normal w/glasses, performs annual eye exam  ADLs: capable  Fall risk: low to none  Home safety: good  Cognitive evaluation: intact to orientation, naming, recall and repetition  EOL planning: adv directives, full code/ I agree  I have personally reviewed and have noted  1. The patient's medical, surgical and social history  2. Their use of alcohol, tobacco or illicit drugs  3. Their current medications and supplements  4. The patient's functional ability including ADL's, fall risks, home safety risks and hearing or visual impairment.  5. Diet and physical activities  6. Evidence for depression or mood disorders - grieving 7. The roster of all physicians providing medical care to patient - is listed in the Snapshot section of the chart and reviewed today.    Today patient counseled on age appropriate routine health concerns for screening and prevention, each reviewed and up to date or declined. Immunizations reviewed and up to date or declined. Labs ordered and reviewed. Risk factors for depression reviewed and negative. Hearing function and visual acuity are intact. ADLs screened and addressed as needed. Functional ability and level of safety reviewed and appropriate. Education, counseling and referrals performed based on assessed risks today. Patient provided with a copy of personalized plan for preventive services.   The pt was advised to see an eye and ear specialists [facility] Dr Paulita Fujita ?2013 Pt declined a flu shot, Shingrix

## 2018-06-25 ENCOUNTER — Ambulatory Visit: Payer: Self-pay | Admitting: *Deleted

## 2018-06-25 NOTE — Telephone Encounter (Signed)
Please advise 

## 2018-06-25 NOTE — Telephone Encounter (Signed)
B/p readings over the last two weeks: 181/65 174/61 148/62 149/52 173/53 Reports his b/p medication was changed 05/08/18 by Dr. Alain Marion. His is taking Coreg 25 MG twice daily. Reports having frequent headaches and some mild jitterness since starting this medication. Also, reports mild ankle edema since starting this medication.  He is asking if he can be changed back to his previous B/P medication? He was taking triamterene-hydrochlorothiazide 37.5-25 MG daily.Pharmacy is [facility]  [address]. Routing to PCP for consideration or appointment. Reason for Disposition . [2] Systolic BP  >= 992 OR Diastolic >= 80 AND [4] taking BP medications  Answer Assessment - Initial Assessment Questions 1. BLOOD PRESSURE: "What is the blood pressure?" "Did you take at least two measurements 5 minutes apart?"     186/71 at 6am. He is currently at work and does not have his home monitor. 2. ONSET: "When did you take your blood pressure?"     This morning 3. HOW: "How did you obtain the blood pressure?" (e.g., visiting nurse, automatic home BP monitor)   Home monitor 4. HISTORY: "Do you have a history of high blood pressure?"     yes 5. MEDICATIONS: "Are you taking any medications for blood pressure?" "Have you missed any doses recently?"     Yes, twice a day. 6. OTHER SYMPTOMS: "Do you have any symptoms?" (e.g., headache, chest pain, blurred vision, difficulty breathing, weakness)     Swelling in ankles, headaches, clumsy at times. 7. PREGNANCY: "Is there any chance you are pregnant?" "When was your last menstrual period?"     na  Protocols used: HIGH BLOOD PRESSURE-A-AH

## 2018-06-26 NOTE — Telephone Encounter (Signed)
The previous medication was a diuretic.  We discontinued it because of the urgency and frequency.  Did the medication change help with urgency and frequency?  If not-it is okay to discontinue Coreg and go back to his previous triamterene/HCTZ. Thank you

## 2018-06-30 MED ORDER — TRIAMTERENE-HCTZ 37.5-25 MG PO TABS: 1.0000 | ORAL_TABLET | Freq: Every day | ORAL | 2 refills | Status: DC

## 2018-06-30 NOTE — Addendum Note (Signed)
Addended by: Karren Cobble on: 06/30/2018 09:31 AM   Modules accepted: Orders

## 2018-06-30 NOTE — Telephone Encounter (Signed)
Pt stated the change of med did not help. Old RX sent to pharmacy

## 2018-08-06 ENCOUNTER — Ambulatory Visit (INDEPENDENT_AMBULATORY_CARE_PROVIDER_SITE_OTHER): Payer: Medicare HMO | Admitting: Internal Medicine

## 2018-08-06 ENCOUNTER — Encounter: Payer: Self-pay | Admitting: Internal Medicine

## 2018-08-06 DIAGNOSIS — R69 Illness, unspecified: Secondary | ICD-10-CM | POA: Diagnosis not present

## 2018-08-06 DIAGNOSIS — E785 Hyperlipidemia, unspecified: Secondary | ICD-10-CM | POA: Diagnosis not present

## 2018-08-06 DIAGNOSIS — F09 Unspecified mental disorder due to known physiological condition: Secondary | ICD-10-CM

## 2018-08-06 DIAGNOSIS — N320 Bladder-neck obstruction: Secondary | ICD-10-CM | POA: Diagnosis not present

## 2018-08-06 DIAGNOSIS — I10 Essential (primary) hypertension: Secondary | ICD-10-CM

## 2018-08-06 NOTE — Assessment & Plan Note (Signed)
Discussed  Working a lot - 10-12 h/d

## 2018-08-06 NOTE — Progress Notes (Signed)
Subjective:  Patient ID: Joshua Adkins, male    DOB: Nov 08, 1935  Age: 83 y.o. MRN: [id number]  CC: No chief complaint on file.   HPI Joshua Adkins presents for dyslipidemia, HTN, memory issues Working a lot - 10-12 h/d  Outpatient Medications Prior to Visit  Medication Sig Dispense Refill  . Ascorbic Acid (VITAMIN C) 1000 MG tablet Take 1,000 mg by mouth every other day. Reported on 09/05/2015    . aspirin EC 81 MG tablet Take 81 mg by mouth daily.    Marland Kitchen atorvastatin (LIPITOR) 10 MG tablet Take 1 tablet (10 mg total) by mouth daily. 90 tablet 3  . Cholecalciferol 1000 UNITS tablet Take 1,000 Units by mouth every other day.    . losartan (COZAAR) 100 MG tablet Take 1 tablet (100 mg total) by mouth daily. 90 tablet 3  . triamterene-hydrochlorothiazide (MAXZIDE-25) 37.5-25 MG tablet Take 1 tablet by mouth daily. 90 tablet 2  . carvedilol (COREG) 25 MG tablet Take 1 tablet (25 mg total) by mouth 2 (two) times daily. (Patient not taking: Reported on 08/06/2018) 180 tablet 3   No facility-administered medications prior to visit.     ROS: Review of Systems  Constitutional: Positive for fatigue. Negative for appetite change and unexpected weight change.  HENT: Negative for congestion, nosebleeds, sneezing, sore throat and trouble swallowing.   Eyes: Negative for itching and visual disturbance.  Respiratory: Negative for cough.   Cardiovascular: Negative for chest pain, palpitations and leg swelling.  Gastrointestinal: Negative for abdominal distention, blood in stool, diarrhea and nausea.  Genitourinary: Negative for frequency and hematuria.  Musculoskeletal: Positive for arthralgias and back pain. Negative for gait problem, joint swelling and neck pain.  Skin: Negative for rash.  Neurological: Negative for dizziness, tremors, speech difficulty and weakness.  Psychiatric/Behavioral: Positive for decreased concentration. Negative for agitation, dysphoric mood, sleep disturbance and  suicidal ideas. The patient is not nervous/anxious.     Objective:  There were no vitals taken for this visit.  BP Readings from Last 3 Encounters:  05/07/18 (!) 158/74  04/25/17 (!) 162/70  04/12/16 (!) 158/82    Wt Readings from Last 3 Encounters:  05/07/18 145 lb (65.8 kg)  04/25/17 142 lb (64.4 kg)  04/12/16 148 lb (67.1 kg)    Physical Exam Constitutional:      General: He is not in acute distress.    Appearance: He is well-developed.     Comments: NAD  Eyes:     Conjunctiva/sclera: Conjunctivae normal.     Pupils: Pupils are equal, round, and reactive to light.  Neck:     Musculoskeletal: Normal range of motion.     Thyroid: No thyromegaly.     Vascular: No JVD.  Cardiovascular:     Rate and Rhythm: Normal rate and regular rhythm.     Heart sounds: Normal heart sounds. No murmur. No friction rub. No gallop.   Pulmonary:     Effort: Pulmonary effort is normal. No respiratory distress.     Breath sounds: Normal breath sounds. No wheezing or rales.  Chest:     Chest wall: No tenderness.  Abdominal:     General: Bowel sounds are normal. There is no distension.     Palpations: Abdomen is soft. There is no mass.     Tenderness: There is no abdominal tenderness. There is no guarding or rebound.  Musculoskeletal: Normal range of motion.        General: No tenderness.  Lymphadenopathy:  Cervical: No cervical adenopathy.  Skin:    General: Skin is warm and dry.     Findings: No rash.  Neurological:     Mental Status: He is alert and oriented to person, place, and time.     Cranial Nerves: No cranial nerve deficit.     Motor: No abnormal muscle tone.     Coordination: Coordination normal.     Gait: Gait normal.     Deep Tendon Reflexes: Reflexes are normal and symmetric.  Psychiatric:        Behavior: Behavior normal.        Thought Content: Thought content normal.        Judgment: Judgment normal.   stiff LS  Lab Results  Component Value Date   WBC 4.4  05/07/2018   HGB 12.0 (L) 05/07/2018   HCT 35.4 (L) 05/07/2018   PLT 130.0 (L) 05/07/2018   GLUCOSE 98 05/07/2018   CHOL 146 05/07/2018   TRIG 85.0 05/07/2018   HDL 56.80 05/07/2018   LDLCALC 72 05/07/2018   ALT 2 05/07/2018   AST 18 05/07/2018   NA 143 05/07/2018   K 4.3 05/07/2018   CL 105 05/07/2018   CREATININE 1.14 05/07/2018   BUN 31 (H) 05/07/2018   CO2 27 05/07/2018   TSH 2.71 05/07/2018   PSA 1.94 05/07/2018    No results found.  Assessment & Plan:   There are no diagnoses linked to this encounter.   No orders of the defined types were placed in this encounter.    Follow-up: No follow-ups on file.  Walker Kehr, MD

## 2018-08-06 NOTE — Assessment & Plan Note (Addendum)
Losartan Coreg - d/c Maxzide A cardiac CT scan for calcium scoring offered

## 2018-08-06 NOTE — Assessment & Plan Note (Signed)
PSA

## 2018-08-06 NOTE — Patient Instructions (Signed)

## 2018-08-06 NOTE — Assessment & Plan Note (Addendum)
Lipitor A cardiac CT scan for calcium scoring offered   

## 2018-09-08 DIAGNOSIS — H524 Presbyopia: Secondary | ICD-10-CM | POA: Diagnosis not present

## 2018-09-08 DIAGNOSIS — H52203 Unspecified astigmatism, bilateral: Secondary | ICD-10-CM | POA: Diagnosis not present

## 2018-09-08 DIAGNOSIS — H25013 Cortical age-related cataract, bilateral: Secondary | ICD-10-CM | POA: Diagnosis not present

## 2018-09-08 DIAGNOSIS — H2513 Age-related nuclear cataract, bilateral: Secondary | ICD-10-CM | POA: Diagnosis not present

## 2018-10-27 DIAGNOSIS — L570 Actinic keratosis: Secondary | ICD-10-CM | POA: Diagnosis not present

## 2018-10-27 DIAGNOSIS — D692 Other nonthrombocytopenic purpura: Secondary | ICD-10-CM | POA: Diagnosis not present

## 2018-10-27 DIAGNOSIS — L821 Other seborrheic keratosis: Secondary | ICD-10-CM | POA: Diagnosis not present

## 2018-10-27 DIAGNOSIS — D0462 Carcinoma in situ of skin of left upper limb, including shoulder: Secondary | ICD-10-CM | POA: Diagnosis not present

## 2018-10-27 DIAGNOSIS — D1801 Hemangioma of skin and subcutaneous tissue: Secondary | ICD-10-CM | POA: Diagnosis not present

## 2018-10-27 DIAGNOSIS — Z85828 Personal history of other malignant neoplasm of skin: Secondary | ICD-10-CM | POA: Diagnosis not present

## 2018-11-13 DIAGNOSIS — Z0100 Encounter for examination of eyes and vision without abnormal findings: Secondary | ICD-10-CM | POA: Diagnosis not present

## 2019-02-09 ENCOUNTER — Ambulatory Visit: Payer: Medicare HMO | Admitting: Internal Medicine

## 2019-02-11 ENCOUNTER — Encounter: Payer: Self-pay | Admitting: Internal Medicine

## 2019-02-11 ENCOUNTER — Other Ambulatory Visit (INDEPENDENT_AMBULATORY_CARE_PROVIDER_SITE_OTHER): Payer: Medicare HMO

## 2019-02-11 ENCOUNTER — Other Ambulatory Visit: Payer: Self-pay

## 2019-02-11 ENCOUNTER — Ambulatory Visit (INDEPENDENT_AMBULATORY_CARE_PROVIDER_SITE_OTHER): Payer: Medicare HMO | Admitting: Internal Medicine

## 2019-02-11 VITALS — BP 130/76 | HR 64 | Temp 98.1°F | Ht 69.0 in | Wt 139.0 lb

## 2019-02-11 DIAGNOSIS — I10 Essential (primary) hypertension: Secondary | ICD-10-CM

## 2019-02-11 DIAGNOSIS — E785 Hyperlipidemia, unspecified: Secondary | ICD-10-CM | POA: Diagnosis not present

## 2019-02-11 DIAGNOSIS — R634 Abnormal weight loss: Secondary | ICD-10-CM

## 2019-02-11 LAB — CBC WITH DIFFERENTIAL/PLATELET
Basophils Absolute: 0 10*3/uL (ref 0.0–0.1)
Basophils Relative: 0.5 % (ref 0.0–3.0)
Eosinophils Absolute: 0 10*3/uL (ref 0.0–0.7)
Eosinophils Relative: 0 % (ref 0.0–5.0)
HCT: 35.6 % — ABNORMAL LOW (ref 39.0–52.0)
Hemoglobin: 12 g/dL — ABNORMAL LOW (ref 13.0–17.0)
Lymphocytes Relative: 32.2 % (ref 12.0–46.0)
Lymphs Abs: 1.5 10*3/uL (ref 0.7–4.0)
MCHC: 33.6 g/dL (ref 30.0–36.0)
MCV: 88.1 fl (ref 78.0–100.0)
Monocytes Absolute: 0.4 10*3/uL (ref 0.1–1.0)
Monocytes Relative: 9 % (ref 3.0–12.0)
Neutro Abs: 2.7 10*3/uL (ref 1.4–7.7)
Neutrophils Relative %: 58.3 % (ref 43.0–77.0)
Platelets: 128 10*3/uL — ABNORMAL LOW (ref 150.0–400.0)
RBC: 4.04 Mil/uL — ABNORMAL LOW (ref 4.22–5.81)
RDW: 14.8 % (ref 11.5–15.5)
WBC: 4.6 10*3/uL (ref 4.0–10.5)

## 2019-02-11 LAB — BASIC METABOLIC PANEL
BUN: 35 mg/dL — ABNORMAL HIGH (ref 6–23)
CO2: 26 mEq/L (ref 19–32)
Calcium: 9.7 mg/dL (ref 8.4–10.5)
Chloride: 106 mEq/L (ref 96–112)
Creatinine, Ser: 1.24 mg/dL (ref 0.40–1.50)
GFR: 55.92 mL/min — ABNORMAL LOW (ref >60.00)
Glucose, Bld: 98 mg/dL (ref 70–99)
Potassium: 4.8 mEq/L (ref 3.5–5.1)
Sodium: 141 mEq/L (ref 135–145)

## 2019-02-11 LAB — URINALYSIS
Bilirubin Urine: NEGATIVE
Hgb urine dipstick: NEGATIVE
Ketones, ur: NEGATIVE
Leukocytes,Ua: NEGATIVE
Nitrite: NEGATIVE
Specific Gravity, Urine: 1.015 (ref 1.000–1.030)
Total Protein, Urine: NEGATIVE
Urine Glucose: NEGATIVE
Urobilinogen, UA: 0.2 (ref 0.0–1.0)
pH: 6 (ref 5.0–8.0)

## 2019-02-11 LAB — HEPATIC FUNCTION PANEL
ALT: 2 U/L (ref 0–53)
AST: 17 U/L (ref 0–37)
Albumin: 4.7 g/dL (ref 3.5–5.2)
Alkaline Phosphatase: 45 U/L (ref 39–117)
Bilirubin, Direct: 0.1 mg/dL (ref 0.0–0.3)
Total Bilirubin: 0.4 mg/dL (ref 0.2–1.2)
Total Protein: 7 g/dL (ref 6.0–8.3)

## 2019-02-11 LAB — TSH: TSH: 2.87 u[IU]/mL (ref 0.35–4.50)

## 2019-02-11 NOTE — Patient Instructions (Signed)
If you have medicare related insurance (such as traditional Medicare, Blue Cross Medicare, [employer] Medicare, or similar), Please make an appointment at the scheduling desk with Jill, the Wellness Health Coach, for your Wellness visit in this office, which is a benefit with your insurance.  

## 2019-02-11 NOTE — Progress Notes (Signed)
Subjective:  Patient ID: Joshua Adkins, male    DOB: 12-Sep-1935  Age: 83 y.o. MRN: [id number]  CC: No chief complaint on file.   HPI Joshua Adkins presents for HTN w/low BP at times, dizziness F/u on  Dyslipidemia C/o wt loss - eating well Working a lot  Outpatient Medications Prior to Visit  Medication Sig Dispense Refill  . Ascorbic Acid (VITAMIN C) 1000 MG tablet Take 1,000 mg by mouth every other day. Reported on 09/05/2015    . aspirin EC 81 MG tablet Take 81 mg by mouth daily.    Marland Kitchen atorvastatin (LIPITOR) 10 MG tablet Take 1 tablet (10 mg total) by mouth daily. 90 tablet 3  . Cholecalciferol 1000 UNITS tablet Take 1,000 Units by mouth every other day.    . losartan (COZAAR) 100 MG tablet Take 1 tablet (100 mg total) by mouth daily. 90 tablet 3  . triamterene-hydrochlorothiazide (MAXZIDE-25) 37.5-25 MG tablet Take 1 tablet by mouth daily. 90 tablet 2   No facility-administered medications prior to visit.     ROS: Review of Systems  Constitutional: Positive for unexpected weight change. Negative for appetite change and fatigue.  HENT: Negative for congestion, nosebleeds, sneezing, sore throat and trouble swallowing.   Eyes: Negative for itching and visual disturbance.  Respiratory: Negative for cough.   Cardiovascular: Negative for chest pain, palpitations and leg swelling.  Gastrointestinal: Negative for abdominal distention, blood in stool, diarrhea and nausea.  Genitourinary: Negative for frequency and hematuria.  Musculoskeletal: Negative for back pain, gait problem, joint swelling and neck pain.  Skin: Negative for rash.  Neurological: Negative for dizziness, tremors, speech difficulty and weakness.  Psychiatric/Behavioral: Negative for agitation, dysphoric mood and sleep disturbance. The patient is not nervous/anxious.     Objective:  BP 130/76 (BP Location: Left Arm, Patient Position: Sitting, Cuff Size: Normal)   Pulse 64   Temp 98.1 F (36.7 C) (Oral)   Ht  5\' 9"  (1.753 m)   Wt 139 lb (63 kg)   SpO2 99%   BMI 20.53 kg/m   BP Readings from Last 3 Encounters:  02/11/19 130/76  08/06/18 140/76  05/07/18 (!) 158/74    Wt Readings from Last 3 Encounters:  02/11/19 139 lb (63 kg)  08/06/18 147 lb (66.7 kg)  05/07/18 145 lb (65.8 kg)    Physical Exam Constitutional:      General: He is not in acute distress.    Appearance: He is well-developed.     Comments: NAD  Eyes:     Conjunctiva/sclera: Conjunctivae normal.     Pupils: Pupils are equal, round, and reactive to light.  Neck:     Musculoskeletal: Normal range of motion.     Thyroid: No thyromegaly.     Vascular: No JVD.  Cardiovascular:     Rate and Rhythm: Normal rate and regular rhythm.     Heart sounds: Normal heart sounds. No murmur. No friction rub. No gallop.   Pulmonary:     Effort: Pulmonary effort is normal. No respiratory distress.     Breath sounds: Normal breath sounds. No wheezing or rales.  Chest:     Chest wall: No tenderness.  Abdominal:     General: Bowel sounds are normal. There is no distension.     Palpations: Abdomen is soft. There is no mass.     Tenderness: There is no abdominal tenderness. There is no guarding or rebound.  Musculoskeletal: Normal range of motion.  General: No tenderness.  Lymphadenopathy:     Cervical: No cervical adenopathy.  Skin:    General: Skin is warm and dry.     Findings: No rash.  Neurological:     Mental Status: He is alert and oriented to person, place, and time.     Cranial Nerves: No cranial nerve deficit.     Motor: No abnormal muscle tone.     Coordination: Coordination normal.     Gait: Gait normal.     Deep Tendon Reflexes: Reflexes are normal and symmetric.  Psychiatric:        Behavior: Behavior normal.        Thought Content: Thought content normal.        Judgment: Judgment normal.   thin  Lab Results  Component Value Date   WBC 4.4 05/07/2018   HGB 12.0 (L) 05/07/2018   HCT 35.4 (L)  05/07/2018   PLT 130.0 (L) 05/07/2018   GLUCOSE 98 05/07/2018   CHOL 146 05/07/2018   TRIG 85.0 05/07/2018   HDL 56.80 05/07/2018   LDLCALC 72 05/07/2018   ALT 2 05/07/2018   AST 18 05/07/2018   NA 143 05/07/2018   K 4.3 05/07/2018   CL 105 05/07/2018   CREATININE 1.14 05/07/2018   BUN 31 (H) 05/07/2018   CO2 27 05/07/2018   TSH 2.71 05/07/2018   PSA 1.94 05/07/2018    No results found.  Assessment & Plan:   There are no diagnoses linked to this encounter.   No orders of the defined types were placed in this encounter.    Follow-up: No follow-ups on file.  Walker Kehr, MD

## 2019-02-11 NOTE — Assessment & Plan Note (Signed)
D/c Maxzide due to low BP

## 2019-02-11 NOTE — Assessment & Plan Note (Signed)
Lipitor 

## 2019-02-11 NOTE — Assessment & Plan Note (Signed)
Will watch Labs

## 2019-03-11 ENCOUNTER — Telehealth: Payer: Self-pay

## 2019-03-11 NOTE — Telephone Encounter (Signed)
Copied from [place] [id number]. Topic: General - Other >> Mar 11, 2019 11:45 AM Carolyn Stare wrote: Pt call to say every since he has been off of the triamtere hydrochlorothiazide his top number bp has been running high and would like a call back , he is currently taking losartan (COZAAR) 100 MG tablet   by it self when he use to take both

## 2019-03-11 NOTE — Telephone Encounter (Signed)
Pt states his last b/p few were: 174/60, 165/96, 169/96, 171/68

## 2019-03-12 MED ORDER — DOXAZOSIN MESYLATE 2 MG PO TABS: 2.0000 mg | ORAL_TABLET | Freq: Every day | ORAL | 3 refills | Status: DC

## 2019-03-12 NOTE — Telephone Encounter (Signed)
I will call in a prescription called Cardura.  Please start taking at night.  Thank you

## 2019-03-12 NOTE — Telephone Encounter (Signed)
Pt.notified

## 2019-05-05 ENCOUNTER — Telehealth: Payer: Self-pay | Admitting: Internal Medicine

## 2019-05-05 NOTE — Telephone Encounter (Signed)
Attempted to call patient to schedule Annual Wellness Visit, but patient did not answer. Will try to call patient again at a later time. SF °

## 2019-05-10 ENCOUNTER — Telehealth: Payer: Self-pay

## 2019-05-10 NOTE — Telephone Encounter (Signed)
Copied from [place] [phone]. Topic: General - Other >> May 06, 2019 12:45 PM Yvette Rack wrote: Reason for CRM: Pt stated the doxazosin (CARDURA) 2 MG tablet is not working. Pt stated it is lowering his bp like the previous medication. Pt requests call back.

## 2019-05-12 NOTE — Telephone Encounter (Signed)
I do not understand. Probably best to schedule office visit.  Bring blood pressure records Thanks

## 2019-05-12 NOTE — Telephone Encounter (Signed)
LMTCB to schedule OV.

## 2019-05-17 ENCOUNTER — Other Ambulatory Visit: Payer: Self-pay | Admitting: Internal Medicine

## 2019-05-21 ENCOUNTER — Other Ambulatory Visit: Payer: Self-pay | Admitting: Internal Medicine

## 2019-07-08 ENCOUNTER — Ambulatory Visit (INDEPENDENT_AMBULATORY_CARE_PROVIDER_SITE_OTHER): Payer: Medicare HMO | Admitting: Internal Medicine

## 2019-07-08 ENCOUNTER — Encounter: Payer: Self-pay | Admitting: Internal Medicine

## 2019-07-08 ENCOUNTER — Other Ambulatory Visit: Payer: Self-pay

## 2019-07-08 DIAGNOSIS — R634 Abnormal weight loss: Secondary | ICD-10-CM | POA: Diagnosis not present

## 2019-07-08 DIAGNOSIS — N320 Bladder-neck obstruction: Secondary | ICD-10-CM

## 2019-07-08 DIAGNOSIS — I10 Essential (primary) hypertension: Secondary | ICD-10-CM

## 2019-07-08 DIAGNOSIS — E785 Hyperlipidemia, unspecified: Secondary | ICD-10-CM

## 2019-07-08 MED ORDER — OLMESARTAN MEDOXOMIL-HCTZ 40-12.5 MG PO TABS: 1.0000 | ORAL_TABLET | Freq: Every day | ORAL | 3 refills | Status: DC

## 2019-07-08 NOTE — Progress Notes (Signed)
Subjective:  Patient ID: Joshua Adkins, male    DOB: 02/23/1936  Age: 84 y.o. MRN: [id number]  CC: No chief complaint on file.   HPI CONAL TREHARNE presents for HTN - elevated BP now F/u dyslipidemia, BPH C/o L inner foot pain x 1-2 wks   Outpatient Medications Prior to Visit  Medication Sig Dispense Refill  . Ascorbic Acid (VITAMIN C) 1000 MG tablet Take 1,000 mg by mouth every other day. Reported on 09/05/2015    . aspirin EC 81 MG tablet Take 81 mg by mouth daily.    Marland Kitchen atorvastatin (LIPITOR) 10 MG tablet Take 1 tablet by mouth once daily 90 tablet 3  . Cholecalciferol 1000 UNITS tablet Take 1,000 Units by mouth every other day.    . doxazosin (CARDURA) 2 MG tablet Take 1 tablet (2 mg total) by mouth daily. 90 tablet 3  . losartan (COZAAR) 100 MG tablet Take 1 tablet (100 mg total) by mouth daily. 90 tablet 3   No facility-administered medications prior to visit.    ROS: Review of Systems  Constitutional: Positive for fatigue. Negative for appetite change and unexpected weight change.  HENT: Negative for congestion, nosebleeds, sneezing, sore throat and trouble swallowing.   Eyes: Negative for itching and visual disturbance.  Respiratory: Negative for cough.   Cardiovascular: Positive for leg swelling. Negative for chest pain and palpitations.  Gastrointestinal: Negative for abdominal distention, blood in stool, diarrhea and nausea.  Genitourinary: Positive for frequency. Negative for hematuria.  Musculoskeletal: Negative for back pain, gait problem, joint swelling and neck pain.  Skin: Negative for rash.  Neurological: Negative for dizziness, tremors, speech difficulty and weakness.  Psychiatric/Behavioral: Negative for agitation, dysphoric mood and sleep disturbance. The patient is not nervous/anxious.     Objective:  BP (!) 160/76 (BP Location: Left Arm, Patient Position: Sitting, Cuff Size: Normal)   Pulse 76   Temp 97.9 F (36.6 C) (Oral)   Ht 5\' 9"  (1.753 m)    Wt 145 lb (65.8 kg)   SpO2 98%   BMI 21.41 kg/m   BP Readings from Last 3 Encounters:  07/08/19 (!) 160/76  02/11/19 130/76  08/06/18 140/76    Wt Readings from Last 3 Encounters:  07/08/19 145 lb (65.8 kg)  02/11/19 139 lb (63 kg)  08/06/18 147 lb (66.7 kg)    Physical Exam Constitutional:      General: He is not in acute distress.    Appearance: He is well-developed.     Comments: NAD  Eyes:     Conjunctiva/sclera: Conjunctivae normal.     Pupils: Pupils are equal, round, and reactive to light.  Neck:     Thyroid: No thyromegaly.     Vascular: No JVD.  Cardiovascular:     Rate and Rhythm: Normal rate and regular rhythm.     Heart sounds: Normal heart sounds. No murmur. No friction rub. No gallop.   Pulmonary:     Effort: Pulmonary effort is normal. No respiratory distress.     Breath sounds: Normal breath sounds. No wheezing or rales.  Chest:     Chest wall: No tenderness.  Abdominal:     General: Bowel sounds are normal. There is no distension.     Palpations: Abdomen is soft. There is no mass.     Tenderness: There is no abdominal tenderness. There is no guarding or rebound.  Musculoskeletal:        General: No tenderness. Normal range of motion.  Cervical back: Normal range of motion.     Right lower leg: Edema present.     Left lower leg: Edema present.  Lymphadenopathy:     Cervical: No cervical adenopathy.  Skin:    General: Skin is warm and dry.     Findings: No rash.  Neurological:     Mental Status: He is alert and oriented to person, place, and time.     Cranial Nerves: No cranial nerve deficit.     Motor: No abnormal muscle tone.     Coordination: Coordination normal.     Gait: Gait normal.     Deep Tendon Reflexes: Reflexes are normal and symmetric.  Psychiatric:        Behavior: Behavior normal.        Thought Content: Thought content normal.        Judgment: Judgment normal.    Trace edema B  Lab Results  Component Value Date   WBC  4.6 02/11/2019   HGB 12.0 (L) 02/11/2019   HCT 35.6 (L) 02/11/2019   PLT 128.0 (L) 02/11/2019   GLUCOSE 98 02/11/2019   CHOL 146 05/07/2018   TRIG 85.0 05/07/2018   HDL 56.80 05/07/2018   LDLCALC 72 05/07/2018   ALT 2 02/11/2019   AST 17 02/11/2019   NA 141 02/11/2019   K 4.8 02/11/2019   CL 106 02/11/2019   CREATININE 1.24 02/11/2019   BUN 35 (H) 02/11/2019   CO2 26 02/11/2019   TSH 2.87 02/11/2019   PSA 1.94 05/07/2018    No results found.  Assessment & Plan:   There are no diagnoses linked to this encounter.   No orders of the defined types were placed in this encounter.    Follow-up: No follow-ups on file.  Walker Kehr, MD

## 2019-07-08 NOTE — Patient Instructions (Addendum)
Stop Losartan and doxazosin Start Olmesartan HCT  You can pre-medicate yourself with Benadryl 25 mg and Tylenol 650 mg 1 hour prior to the vaccination.  Compression socks

## 2019-07-08 NOTE — Assessment & Plan Note (Signed)
D/c Cardura - leg swelling

## 2019-07-08 NOTE — Assessment & Plan Note (Signed)
Lipitor 

## 2019-07-08 NOTE — Assessment & Plan Note (Signed)
Stop Losartan and doxazosin Start Olmesartan HCT

## 2019-07-12 NOTE — Assessment & Plan Note (Signed)
Wt Readings from Last 3 Encounters:  07/08/19 145 lb (65.8 kg)  02/11/19 139 lb (63 kg)  08/06/18 147 lb (66.7 kg)  better

## 2019-07-22 ENCOUNTER — Telehealth: Payer: Self-pay

## 2019-07-22 NOTE — Telephone Encounter (Signed)
New message    1. What are your BP readings?   07/21/19------171/69 pulse 66 @ 5 :00pm 182/76 pulse  65    07/22/19 @ 8:00am ------152/61 pulse 67 @ 11:15am ---------------192/74 pulse 64    2. Are you having any other symptoms (ex. Dizziness, headache, blurred vision, passed out)? No   3. What is your BP issue? olmesartan-hydrochlorothiazide (BENICAR HCT) 40-12.5 MG tablet - B/p going up   Please advise.

## 2019-07-23 ENCOUNTER — Encounter: Payer: Self-pay | Admitting: Family

## 2019-07-23 ENCOUNTER — Ambulatory Visit (INDEPENDENT_AMBULATORY_CARE_PROVIDER_SITE_OTHER): Payer: Medicare HMO | Admitting: Family

## 2019-07-23 ENCOUNTER — Encounter: Payer: Self-pay | Admitting: Internal Medicine

## 2019-07-23 ENCOUNTER — Other Ambulatory Visit: Payer: Self-pay

## 2019-07-23 VITALS — BP 148/80 | HR 58 | Temp 97.6°F | Ht 69.0 in | Wt 145.8 lb

## 2019-07-23 DIAGNOSIS — I10 Essential (primary) hypertension: Secondary | ICD-10-CM

## 2019-07-23 MED ORDER — HYDROCHLOROTHIAZIDE 12.5 MG PO CAPS: 12.5000 mg | ORAL_CAPSULE | Freq: Every day | ORAL | 0 refills | Status: DC

## 2019-07-23 NOTE — Progress Notes (Signed)
Joshua Adkins is a 84 y.o. male with the following history as recorded in [employer]:  Patient Active Problem List   Diagnosis Date Noted  . Weight loss 02/11/2019  . Mild cognitive disorder 08/06/2018  . Grief at loss of child 04/25/2017  . Bladder neck obstruction 04/12/2016  . Irregular heart beats 04/12/2016  . Tick bite 09/05/2015  . Well adult exam 02/17/2015  . Essential hypertension 02/17/2015  . Dyslipidemia 02/17/2015  . Cerumen impaction 02/17/2015  . Hearing loss d/t noise 02/17/2015    Current Outpatient Medications  Medication Sig Dispense Refill  . Ascorbic Acid (VITAMIN C) 1000 MG tablet Take 1,000 mg by mouth every other day. Reported on 09/05/2015    . aspirin EC 81 MG tablet Take 81 mg by mouth daily.    Marland Kitchen atorvastatin (LIPITOR) 10 MG tablet Take 1 tablet by mouth once daily 90 tablet 3  . Cholecalciferol 1000 UNITS tablet Take 1,000 Units by mouth every other day.    . olmesartan-hydrochlorothiazide (BENICAR HCT) 40-12.5 MG tablet Take 1 tablet by mouth daily. 90 tablet 3  . hydrochlorothiazide (MICROZIDE) 12.5 MG capsule Take 1 capsule (12.5 mg total) by mouth daily. 30 capsule 0   No current facility-administered medications for this visit.    Allergies: Amlodipine, Cardura [doxazosin], and Carvedilol  Past Medical History:  Diagnosis Date  . Hyperlipidemia   . Hypertension     Past Surgical History:  Procedure Laterality Date  . APPENDECTOMY      Family History  Problem Relation Age of Onset  . Cancer Mother        Breast  . Heart disease Father   . Cancer Sister        Colon   . Cancer Daughter        Brain    Social History   Tobacco Use  . Smoking status: Never Smoker  . Smokeless tobacco: Never Used  Substance Use Topics  . Alcohol use: No    Subjective:  Patient saw his PCP approximately 2 weeks ago- was changed from Losartan/ Cardura to Olmesartan HCT 40/12. 5 mg; patient is concerned that medication is not working well for him; has  been having some mild headaches and blood pressure is continuing to be elevated. He notes that he took an extra 1/2 dose of the Olmesartan last night but has not taken any medication yet today ( appointment time today is 3:45 pm).  Still having some lower extremity swelling but slightly improved with stopping Cardura;    Objective:  Vitals:   07/23/19 1513  BP: (!) 148/80  Pulse: (!) 58  Temp: 97.6 F (36.4 C)  TempSrc: Oral  SpO2: 99%  Weight: 145 lb 12.8 oz (66.1 kg)  Height: 5\' 9"  (1.753 m)    General: Well developed, well nourished, in no acute distress  Skin : Warm and dry.  Head: Normocephalic and atraumatic  Lungs: Respirations unlabored; clear to auscultation bilaterally without wheeze, rales, rhonchi  CVS exam: normal rate and regular rhythm.  Extremities: bilateral pedal edema, no cyanosis, no clubbing  Vessels: Symmetric bilaterally  Neurologic: Alert and oriented; speech intact; face symmetrical; moves all extremities well; CNII-XII intact without focal deficit   Assessment:  1. Essential hypertension     Plan:  Continue Olmesartan HCT 40/12.5 and will add HCTZ 12.5 mg daily; EKG done in office- NSR; planned follow-up with Dr. Alain Marion for 1 week- bring log and cuff to next appointment.   This visit occurred during the SARS-CoV-2  public health emergency.  Safety protocols were in place, including screening questions prior to the visit, additional usage of staff PPE, and extensive cleaning of exam room while observing appropriate contact time as indicated for disinfecting solutions.     No follow-ups on file.  No orders of the defined types were placed in this encounter.   Requested Prescriptions   Signed Prescriptions Disp Refills  . hydrochlorothiazide (MICROZIDE) 12.5 MG capsule 30 capsule 0    Sig: Take 1 capsule (12.5 mg total) by mouth daily.

## 2019-07-23 NOTE — Patient Instructions (Signed)
Please take the Olmesartan HCT 40/12.5 mg and the additional HCTZ 12.5 mg in the mornings together.  Bring your blood pressure log and blood pressure cuff to your next appointment.

## 2019-07-23 NOTE — Telephone Encounter (Signed)
Office visit with any provider today please.  thanks

## 2019-07-23 NOTE — Telephone Encounter (Signed)
Called pt he states he did not take his BP med this am, and his BP this am was 170/68. Inform him Dr. Alain Marion want him to be seen today w/any provider. Made appt w/Laura NPO @ 3:00.Marland KitchenJohny Chess

## 2019-07-24 ENCOUNTER — Other Ambulatory Visit: Payer: Self-pay

## 2019-07-24 ENCOUNTER — Ambulatory Visit (INDEPENDENT_AMBULATORY_CARE_PROVIDER_SITE_OTHER): Payer: Medicare HMO

## 2019-07-24 ENCOUNTER — Ambulatory Visit (HOSPITAL_COMMUNITY)
Admission: EM | Admit: 2019-07-24 | Discharge: 2019-07-24 | Disposition: A | Discharge status: Home / Self Care | Payer: Medicare HMO | Attending: Urgent Care | Admitting: Urgent Care

## 2019-07-24 ENCOUNTER — Encounter (HOSPITAL_COMMUNITY): Payer: Self-pay

## 2019-07-24 DIAGNOSIS — S6992XA Unspecified injury of left wrist, hand and finger(s), initial encounter: Secondary | ICD-10-CM | POA: Diagnosis not present

## 2019-07-24 DIAGNOSIS — S61512A Laceration without foreign body of left wrist, initial encounter: Secondary | ICD-10-CM

## 2019-07-24 DIAGNOSIS — Z23 Encounter for immunization: Secondary | ICD-10-CM

## 2019-07-24 DIAGNOSIS — S60212A Contusion of left wrist, initial encounter: Secondary | ICD-10-CM | POA: Diagnosis not present

## 2019-07-24 DIAGNOSIS — W101XXA Fall (on)(from) sidewalk curb, initial encounter: Secondary | ICD-10-CM | POA: Diagnosis not present

## 2019-07-24 DIAGNOSIS — W19XXXA Unspecified fall, initial encounter: Secondary | ICD-10-CM

## 2019-07-24 DIAGNOSIS — M25532 Pain in left wrist: Secondary | ICD-10-CM | POA: Diagnosis not present

## 2019-07-24 MED ORDER — TETANUS-DIPHTH-ACELL PERTUSSIS 5-2.5-18.5 LF-MCG/0.5 IM SUSP
INTRAMUSCULAR | Status: AC
  Filled 2019-07-24: qty 0.5

## 2019-07-24 MED ORDER — CEPHALEXIN 250 MG PO CAPS: 500.0000 mg | ORAL_CAPSULE | Freq: Three times a day (TID) | ORAL | 0 refills | Status: DC

## 2019-07-24 MED ORDER — TETANUS-DIPHTH-ACELL PERTUSSIS 5-2.5-18.5 LF-MCG/0.5 IM SUSP
0.5000 mL | Freq: Once | INTRAMUSCULAR | Status: AC
  Administered 2019-07-24: 0.5 mL via INTRAMUSCULAR

## 2019-07-24 NOTE — ED Provider Notes (Signed)
[place]   MRN: [id number] DOB: [birth year]  Subjective:   Joshua Adkins is a 84 y.o. male presenting for suffering a fall today.  Patient states it was accidental, was walking trying to carry a microwave to replace his broken one at home.  He slipped over a curb and fell with the microwave making impact onto pavement and cutting his left hand.  Patient states that he did not hit his head, did not lose consciousness.  Has not had any confusion, headache or vision change, weakness.  Cannot recall his last Tdap.  Patient states he did have some bruising on his hand to begin with.  No current facility-administered medications for this encounter.  Current Outpatient Medications:  .  Ascorbic Acid (VITAMIN C) 1000 MG tablet, Take 1,000 mg by mouth every other day. Reported on 09/05/2015, Disp: , Rfl:  .  aspirin EC 81 MG tablet, Take 81 mg by mouth daily., Disp: , Rfl:  .  atorvastatin (LIPITOR) 10 MG tablet, Take 1 tablet by mouth once daily, Disp: 90 tablet, Rfl: 3 .  Cholecalciferol 1000 UNITS tablet, Take 1,000 Units by mouth every other day., Disp: , Rfl:  .  hydrochlorothiazide (MICROZIDE) 12.5 MG capsule, Take 1 capsule (12.5 mg total) by mouth daily., Disp: 30 capsule, Rfl: 0 .  olmesartan-hydrochlorothiazide (BENICAR HCT) 40-12.5 MG tablet, Take 1 tablet by mouth daily., Disp: 90 tablet, Rfl: 3   Allergies  Allergen Reactions  . Amlodipine     edema  . Cardura [Doxazosin]     Leg edema  . Carvedilol     swelling    Past Medical History:  Diagnosis Date  . Hyperlipidemia   . Hypertension      Past Surgical History:  Procedure Laterality Date  . APPENDECTOMY      Family History  Problem Relation Age of Onset  . Cancer Mother        Breast  . Heart disease Father   . Cancer Sister        Colon   . Cancer Daughter        Brain    Social History   Tobacco Use  . Smoking status: Never Smoker  . Smokeless tobacco: Never Used  Substance Use Topics  .  Alcohol use: No  . Drug use: No    ROS   Objective:   Vitals: BP (!) 185/79 (BP Location: Right Arm)   Pulse 79   Temp 98.4 F (36.9 C) (Oral)   Resp 16   SpO2 98%   Physical Exam Constitutional:      General: He is not in acute distress.    Appearance: Normal appearance. He is well-developed and normal weight. He is not ill-appearing, toxic-appearing or diaphoretic.  HENT:     Head: Normocephalic and atraumatic.     Right Ear: External ear normal.     Left Ear: External ear normal.     Nose: Nose normal.     Mouth/Throat:     Mouth: Mucous membranes are moist.     Pharynx: Oropharynx is clear.  Eyes:     General: No scleral icterus.       Right eye: No discharge.        Left eye: No discharge.     Extraocular Movements: Extraocular movements intact.     Pupils: Pupils are equal, round, and reactive to light.  Cardiovascular:     Rate and Rhythm: Normal rate and regular rhythm.     Heart  sounds: Normal heart sounds. No murmur. No friction rub. No gallop.   Pulmonary:     Effort: Pulmonary effort is normal. No respiratory distress.     Breath sounds: Normal breath sounds. No stridor. No wheezing, rhonchi or rales.  Musculoskeletal:       Arms:     Cervical back: Normal range of motion.  Skin:    General: Skin is warm and dry.  Neurological:     Mental Status: He is alert and oriented to person, place, and time.     Cranial Nerves: No cranial nerve deficit.     Motor: No weakness.     Coordination: Coordination normal.  Psychiatric:        Mood and Affect: Mood normal.        Behavior: Behavior normal.        Thought Content: Thought content normal.        Judgment: Judgment normal.     DG Wrist Complete Left  Result Date: 07/24/2019 CLINICAL DATA:  [place] onto outstretched hand today. Left wrist pain. Initial encounter. EXAM: LEFT WRIST - COMPLETE 3+ VIEW COMPARISON:  None. FINDINGS: No evidence of fracture or dislocation. Severe osteoarthritis is seen  involving the base of the thumb. Moderate osteoarthritis is also seen involving the radiocarpal joint space. Widening of scapholunate joint is consistent with ligamentous injury. Generalized osteopenia also noted. IMPRESSION: 1. No acute findings. 2. Advanced osteoarthritis of base of thumb and radiocarpal joint space. Electronically Signed   By: Marlaine Hind M.D.   On: 07/24/2019 17:24    PROCEDURE NOTE: laceration repair Verbal consent obtained from patient.  Local anesthesia with 10cc Lidocaine 2% with epinephrine.  Wound explored for tendon, ligament damage. Wound scrubbed with soap and water and rinsed. Wound closed with 4-0 Prolene (simple interrupted) sutures.  Wound cleansed and dressed.   Assessment and Plan :   1. Fall, initial encounter   2. Left wrist pain   3. Laceration of left wrist, initial encounter   4. Contusion of left wrist, initial encounter   5. Need for diphtheria-tetanus-pertussis (Tdap) vaccine     Physical exam findings reassuring, x-ray negative for fracture.  Tdap updated today.  Laceration repaired successfully. Wound care reviewed.  Due to the extent of his wound and depth, high risk for infection of his hand will start patient on Keflex.  He is to use Tylenol for pain.  Strict ER and return-to-clinic precautions discussed, patient verbalized understanding. Otherwise, follow up in 7 to 10 days for suture removal.    Jaynee Eagles, PA-C 07/25/19 1020

## 2019-07-24 NOTE — Discharge Instructions (Signed)

## 2019-07-24 NOTE — ED Triage Notes (Signed)
Patient presents to Urgent Care with complaints of left wrist and hand pain and bleeding since tripping while carrying a microwave this evening. Patient reports he did not hit his head; wrist bandaged upon arrival.  Pt denies blood thinner use.

## 2019-07-29 ENCOUNTER — Other Ambulatory Visit: Payer: Self-pay

## 2019-07-29 ENCOUNTER — Encounter: Payer: Self-pay | Admitting: Internal Medicine

## 2019-07-29 ENCOUNTER — Ambulatory Visit (INDEPENDENT_AMBULATORY_CARE_PROVIDER_SITE_OTHER): Payer: Medicare HMO | Admitting: Internal Medicine

## 2019-07-29 DIAGNOSIS — I10 Essential (primary) hypertension: Secondary | ICD-10-CM

## 2019-07-29 LAB — BASIC METABOLIC PANEL
BUN: 30 mg/dL — ABNORMAL HIGH (ref 6–23)
CO2: 30 mEq/L (ref 19–32)
Calcium: 9.6 mg/dL (ref 8.4–10.5)
Chloride: 103 mEq/L (ref 96–112)
Creatinine, Ser: 1.04 mg/dL (ref 0.40–1.50)
GFR: 68.42 mL/min (ref >60.00)
Glucose, Bld: 108 mg/dL — ABNORMAL HIGH (ref 70–99)
Potassium: 4 mEq/L (ref 3.5–5.1)
Sodium: 139 mEq/L (ref 135–145)

## 2019-07-29 MED ORDER — TERAZOSIN HCL 2 MG PO CAPS: 2.0000 mg | ORAL_CAPSULE | Freq: Every day | ORAL | 3 refills | Status: DC

## 2019-07-29 NOTE — Assessment & Plan Note (Signed)
Worse Add Hytrin

## 2019-07-29 NOTE — Progress Notes (Signed)
Subjective:  Patient ID: Joshua Adkins, male    DOB: January 02, 1936  Age: 84 y.o. MRN: [id number]  CC: No chief complaint on file.   HPI Joshua Adkins presents for high BP; SBP 160-192 F/u dyslipidemia, L forearm laceration  Outpatient Medications Prior to Visit  Medication Sig Dispense Refill  . Ascorbic Acid (VITAMIN C) 1000 MG tablet Take 1,000 mg by mouth every other day. Reported on 09/05/2015    . aspirin EC 81 MG tablet Take 81 mg by mouth daily.    Marland Kitchen atorvastatin (LIPITOR) 10 MG tablet Take 1 tablet by mouth once daily 90 tablet 3  . cephALEXin (KEFLEX) 250 MG capsule Take 2 capsules (500 mg total) by mouth 3 (three) times daily. 21 capsule 0  . Cholecalciferol 1000 UNITS tablet Take 1,000 Units by mouth every other day.    . hydrochlorothiazide (MICROZIDE) 12.5 MG capsule Take 1 capsule (12.5 mg total) by mouth daily. 30 capsule 0  . olmesartan-hydrochlorothiazide (BENICAR HCT) 40-12.5 MG tablet Take 1 tablet by mouth daily. 90 tablet 3   No facility-administered medications prior to visit.    ROS: Review of Systems  Constitutional: Negative for appetite change, fatigue and unexpected weight change.  HENT: Negative for congestion, nosebleeds, sneezing, sore throat and trouble swallowing.   Eyes: Negative for itching and visual disturbance.  Respiratory: Negative for cough.   Cardiovascular: Negative for chest pain, palpitations and leg swelling.  Gastrointestinal: Negative for abdominal distention, blood in stool, diarrhea and nausea.  Genitourinary: Negative for frequency and hematuria.  Musculoskeletal: Negative for back pain, gait problem, joint swelling and neck pain.  Skin: Positive for wound. Negative for rash.  Neurological: Negative for dizziness, tremors, speech difficulty and weakness.  Psychiatric/Behavioral: Negative for agitation, dysphoric mood and sleep disturbance. The patient is not nervous/anxious.     Objective:  BP (!) 158/70 (BP Location: Right  Arm, Patient Position: Sitting, Cuff Size: Normal)   Pulse (!) 54   Temp 97.9 F (36.6 C) (Oral)   Ht 5\' 9"  (1.753 m)   Wt 142 lb (64.4 kg)   SpO2 98%   BMI 20.97 kg/m   BP Readings from Last 3 Encounters:  07/29/19 (!) 158/70  07/24/19 (!) 185/79  07/23/19 (!) 148/80    Wt Readings from Last 3 Encounters:  07/29/19 142 lb (64.4 kg)  07/23/19 145 lb 12.8 oz (66.1 kg)  07/08/19 145 lb (65.8 kg)    Physical Exam Constitutional:      General: He is not in acute distress.    Appearance: He is well-developed.     Comments: NAD  Eyes:     Conjunctiva/sclera: Conjunctivae normal.     Pupils: Pupils are equal, round, and reactive to light.  Neck:     Thyroid: No thyromegaly.     Vascular: No JVD.  Cardiovascular:     Rate and Rhythm: Normal rate and regular rhythm.     Heart sounds: Normal heart sounds. No murmur. No friction rub. No gallop.   Pulmonary:     Effort: Pulmonary effort is normal. No respiratory distress.     Breath sounds: Normal breath sounds. No wheezing or rales.  Chest:     Chest wall: No tenderness.  Abdominal:     General: Bowel sounds are normal. There is no distension.     Palpations: Abdomen is soft. There is no mass.     Tenderness: There is no abdominal tenderness. There is no guarding or rebound.  Musculoskeletal:  General: No tenderness. Normal range of motion.     Cervical back: Normal range of motion.  Lymphadenopathy:     Cervical: No cervical adenopathy.  Skin:    General: Skin is warm and dry.     Findings: No rash.  Neurological:     Mental Status: He is alert and oriented to person, place, and time.     Cranial Nerves: No cranial nerve deficit.     Motor: No abnormal muscle tone.     Coordination: Coordination normal.     Gait: Gait normal.     Deep Tendon Reflexes: Reflexes are normal and symmetric.  Psychiatric:        Behavior: Behavior normal.        Thought Content: Thought content normal.        Judgment: Judgment  normal.   dressed L forearm  Lab Results  Component Value Date   WBC 4.6 02/11/2019   HGB 12.0 (L) 02/11/2019   HCT 35.6 (L) 02/11/2019   PLT 128.0 (L) 02/11/2019   GLUCOSE 98 02/11/2019   CHOL 146 05/07/2018   TRIG 85.0 05/07/2018   HDL 56.80 05/07/2018   LDLCALC 72 05/07/2018   ALT 2 02/11/2019   AST 17 02/11/2019   NA 141 02/11/2019   K 4.8 02/11/2019   CL 106 02/11/2019   CREATININE 1.24 02/11/2019   BUN 35 (H) 02/11/2019   CO2 26 02/11/2019   TSH 2.87 02/11/2019   PSA 1.94 05/07/2018    DG Wrist Complete Left  Result Date: 07/24/2019 CLINICAL DATA:  [place] onto outstretched hand today. Left wrist pain. Initial encounter. EXAM: LEFT WRIST - COMPLETE 3+ VIEW COMPARISON:  None. FINDINGS: No evidence of fracture or dislocation. Severe osteoarthritis is seen involving the base of the thumb. Moderate osteoarthritis is also seen involving the radiocarpal joint space. Widening of scapholunate joint is consistent with ligamentous injury. Generalized osteopenia also noted. IMPRESSION: 1. No acute findings. 2. Advanced osteoarthritis of base of thumb and radiocarpal joint space. Electronically Signed   By: Marlaine Hind M.D.   On: 07/24/2019 17:24    Assessment & Plan:   There are no diagnoses linked to this encounter.   No orders of the defined types were placed in this encounter.    Follow-up: No follow-ups on file.  Walker Kehr, MD

## 2019-08-01 ENCOUNTER — Encounter: Payer: Self-pay | Admitting: Internal Medicine

## 2019-08-05 ENCOUNTER — Encounter: Payer: Self-pay | Admitting: Internal Medicine

## 2019-08-05 ENCOUNTER — Ambulatory Visit (INDEPENDENT_AMBULATORY_CARE_PROVIDER_SITE_OTHER): Payer: Medicare HMO | Admitting: Internal Medicine

## 2019-08-05 ENCOUNTER — Other Ambulatory Visit: Payer: Self-pay

## 2019-08-05 DIAGNOSIS — S61412D Laceration without foreign body of left hand, subsequent encounter: Secondary | ICD-10-CM

## 2019-08-05 DIAGNOSIS — I10 Essential (primary) hypertension: Secondary | ICD-10-CM | POA: Diagnosis not present

## 2019-08-05 DIAGNOSIS — S61419A Laceration without foreign body of unspecified hand, initial encounter: Secondary | ICD-10-CM | POA: Insufficient documentation

## 2019-08-05 DIAGNOSIS — N320 Bladder-neck obstruction: Secondary | ICD-10-CM | POA: Diagnosis not present

## 2019-08-05 MED ORDER — TERAZOSIN HCL 2 MG PO CAPS: 4.0000 mg | ORAL_CAPSULE | Freq: Every day | ORAL | 3 refills | Status: DC

## 2019-08-05 NOTE — Assessment & Plan Note (Signed)
Hytrin - increase to 4 mg a day

## 2019-08-05 NOTE — Assessment & Plan Note (Signed)
Sutures removed Wound is dressed

## 2019-08-05 NOTE — Progress Notes (Signed)
Subjective:  Patient ID: Joshua Adkins, male    DOB: 1935/07/31  Age: 84 y.o. MRN: [id number]  CC: No chief complaint on file.   HPI JAIREN TREMBLY presents for HTN -not better, BPH - better Follow-up on left hand laceration.  Needs suture removal from L hand wound  Outpatient Medications Prior to Visit  Medication Sig Dispense Refill  . Ascorbic Acid (VITAMIN C) 1000 MG tablet Take 1,000 mg by mouth every other day. Reported on 09/05/2015    . aspirin EC 81 MG tablet Take 81 mg by mouth daily.    Marland Kitchen atorvastatin (LIPITOR) 10 MG tablet Take 1 tablet by mouth once daily 90 tablet 3  . cephALEXin (KEFLEX) 250 MG capsule Take 2 capsules (500 mg total) by mouth 3 (three) times daily. 21 capsule 0  . Cholecalciferol 1000 UNITS tablet Take 1,000 Units by mouth every other day.    . hydrochlorothiazide (MICROZIDE) 12.5 MG capsule Take 1 capsule (12.5 mg total) by mouth daily. 30 capsule 0  . olmesartan-hydrochlorothiazide (BENICAR HCT) 40-12.5 MG tablet Take 1 tablet by mouth daily. 90 tablet 3  . terazosin (HYTRIN) 2 MG capsule Take 1 capsule (2 mg total) by mouth at bedtime. 90 capsule 3   No facility-administered medications prior to visit.    ROS: Review of Systems  Constitutional: Negative for appetite change, fatigue and unexpected weight change.  HENT: Negative for congestion, nosebleeds, sneezing, sore throat and trouble swallowing.   Eyes: Negative for itching and visual disturbance.  Respiratory: Negative for cough.   Cardiovascular: Negative for chest pain, palpitations and leg swelling.  Gastrointestinal: Negative for abdominal distention, blood in stool, diarrhea and nausea.  Genitourinary: Negative for frequency and hematuria.  Musculoskeletal: Positive for arthralgias. Negative for back pain, gait problem, joint swelling and neck pain.  Skin: Positive for wound. Negative for rash.  Neurological: Negative for dizziness, tremors, speech difficulty and weakness.    Psychiatric/Behavioral: Negative for agitation, dysphoric mood and sleep disturbance. The patient is not nervous/anxious.     Objective:  BP (!) 164/70 (BP Location: Right Arm, Patient Position: Sitting, Cuff Size: Normal)   Pulse 67   Temp 98 F (36.7 C) (Oral)   Ht 5\' 9"  (1.753 m)   Wt 142 lb (64.4 kg)   SpO2 94%   BMI 20.97 kg/m   BP Readings from Last 3 Encounters:  08/05/19 (!) 164/70  07/29/19 (!) 158/70  07/24/19 (!) 185/79    Wt Readings from Last 3 Encounters:  08/05/19 142 lb (64.4 kg)  07/29/19 142 lb (64.4 kg)  07/23/19 145 lb 12.8 oz (66.1 kg)    Physical Exam Constitutional:      General: He is not in acute distress.    Appearance: He is well-developed.     Comments: NAD  Eyes:     Conjunctiva/sclera: Conjunctivae normal.     Pupils: Pupils are equal, round, and reactive to light.  Neck:     Thyroid: No thyromegaly.     Vascular: No JVD.  Cardiovascular:     Rate and Rhythm: Normal rate and regular rhythm.     Heart sounds: Normal heart sounds. No murmur. No friction rub. No gallop.   Pulmonary:     Effort: Pulmonary effort is normal. No respiratory distress.     Breath sounds: Normal breath sounds. No wheezing or rales.  Chest:     Chest wall: No tenderness.  Abdominal:     General: Bowel sounds are normal. There is no  distension.     Palpations: Abdomen is soft. There is no mass.     Tenderness: There is no abdominal tenderness. There is no guarding or rebound.  Musculoskeletal:        General: No tenderness. Normal range of motion.     Cervical back: Normal range of motion.  Lymphadenopathy:     Cervical: No cervical adenopathy.  Skin:    General: Skin is warm and dry.     Findings: No rash.  Neurological:     Mental Status: He is alert and oriented to person, place, and time.     Cranial Nerves: No cranial nerve deficit.     Motor: No abnormal muscle tone.     Coordination: Coordination abnormal.     Gait: Gait normal.     Deep Tendon  Reflexes: Reflexes are normal and symmetric.  Psychiatric:        Behavior: Behavior normal.        Thought Content: Thought content normal.        Judgment: Judgment normal.   Laceration on the left hand seems to be healing well.  Sutures removed.  Dressing applied.  Gauze wrap applied.  Wound instructions provided  Time 29 min  Lab Results  Component Value Date   WBC 4.6 02/11/2019   HGB 12.0 (L) 02/11/2019   HCT 35.6 (L) 02/11/2019   PLT 128.0 (L) 02/11/2019   GLUCOSE 108 (H) 07/29/2019   CHOL 146 05/07/2018   TRIG 85.0 05/07/2018   HDL 56.80 05/07/2018   LDLCALC 72 05/07/2018   ALT 2 02/11/2019   AST 17 02/11/2019   NA 139 07/29/2019   K 4.0 07/29/2019   CL 103 07/29/2019   CREATININE 1.04 07/29/2019   BUN 30 (H) 07/29/2019   CO2 30 07/29/2019   TSH 2.87 02/11/2019   PSA 1.94 05/07/2018    DG Wrist Complete Left  Result Date: 07/24/2019 CLINICAL DATA:  [place] [address] onto outstretched hand today. Left wrist pain. Initial encounter. EXAM: LEFT WRIST - COMPLETE 3+ VIEW COMPARISON:  None. FINDINGS: No evidence of fracture or dislocation. Severe osteoarthritis is seen involving the base of the thumb. Moderate osteoarthritis is also seen involving the radiocarpal joint space. Widening of scapholunate joint is consistent with ligamentous injury. Generalized osteopenia also noted. IMPRESSION: 1. No acute findings. 2. Advanced osteoarthritis of base of thumb and radiocarpal joint space. Electronically Signed   By: Marlaine Hind M.D.   On: 07/24/2019 17:24    Assessment & Plan:   There are no diagnoses linked to this encounter.   Meds ordered this encounter  Medications  . terazosin (HYTRIN) 2 MG capsule    Sig: Take 2 capsules (4 mg total) by mouth at bedtime.    Dispense:  180 capsule    Refill:  3     Follow-up: No follow-ups on file.  Walker Kehr, MD

## 2019-08-06 ENCOUNTER — Encounter: Payer: Self-pay | Admitting: Internal Medicine

## 2019-08-19 ENCOUNTER — Other Ambulatory Visit: Payer: Self-pay | Admitting: Family

## 2019-08-26 ENCOUNTER — Ambulatory Visit: Payer: Medicare HMO | Admitting: Internal Medicine

## 2019-09-22 ENCOUNTER — Ambulatory Visit (INDEPENDENT_AMBULATORY_CARE_PROVIDER_SITE_OTHER): Payer: Medicare HMO | Admitting: Internal Medicine

## 2019-09-22 ENCOUNTER — Other Ambulatory Visit: Payer: Self-pay

## 2019-09-22 ENCOUNTER — Encounter: Payer: Self-pay | Admitting: Internal Medicine

## 2019-09-22 DIAGNOSIS — I10 Essential (primary) hypertension: Secondary | ICD-10-CM

## 2019-09-22 DIAGNOSIS — R42 Dizziness and giddiness: Secondary | ICD-10-CM | POA: Diagnosis not present

## 2019-09-22 DIAGNOSIS — E785 Hyperlipidemia, unspecified: Secondary | ICD-10-CM

## 2019-09-22 DIAGNOSIS — N320 Bladder-neck obstruction: Secondary | ICD-10-CM

## 2019-09-22 MED ORDER — OLMESARTAN MEDOXOMIL-HCTZ 40-25 MG PO TABS: 1.0000 | ORAL_TABLET | Freq: Every day | ORAL | 3 refills | Status: DC

## 2019-09-22 MED ORDER — TERAZOSIN HCL 5 MG PO CAPS: 5.0000 mg | ORAL_CAPSULE | Freq: Every day | ORAL | 3 refills | Status: DC

## 2019-09-22 NOTE — Patient Instructions (Signed)

## 2019-09-22 NOTE — Assessment & Plan Note (Signed)
Conservative approach w/BP meds/control Hydration

## 2019-09-22 NOTE — Assessment & Plan Note (Signed)
Benicar HCT 40/25 Hytrin 5 mg D/c HCTZ

## 2019-09-22 NOTE — Assessment & Plan Note (Signed)
On Lipitor 

## 2019-09-22 NOTE — Progress Notes (Signed)
Subjective:  Patient ID: Joshua Adkins, male    DOB: 07/24/1935  Age: 84 y.o. MRN: [id number]  CC: No chief complaint on file.   HPI Joshua Adkins presents for HTN f/u BP at home 120-150, sometimes it is low at 99/41 and he is working full time  Outpatient Medications Prior to Visit  Medication Sig Dispense Refill  . Ascorbic Acid (VITAMIN C) 1000 MG tablet Take 1,000 mg by mouth every other day. Reported on 09/05/2015    . aspirin EC 81 MG tablet Take 81 mg by mouth daily.    Marland Kitchen atorvastatin (LIPITOR) 10 MG tablet Take 1 tablet by mouth once daily 90 tablet 3  . Cholecalciferol 1000 UNITS tablet Take 1,000 Units by mouth every other day.    . hydrochlorothiazide (MICROZIDE) 12.5 MG capsule Take 1 capsule by mouth once daily 30 capsule 5  . olmesartan-hydrochlorothiazide (BENICAR HCT) 40-12.5 MG tablet Take 1 tablet by mouth daily. 90 tablet 3  . terazosin (HYTRIN) 2 MG capsule Take 2 capsules (4 mg total) by mouth at bedtime. 180 capsule 3  . cephALEXin (KEFLEX) 250 MG capsule Take 2 capsules (500 mg total) by mouth 3 (three) times daily. 21 capsule 0   No facility-administered medications prior to visit.    ROS: Review of Systems  Constitutional: Negative for appetite change, fatigue and unexpected weight change.  HENT: Negative for congestion, nosebleeds, sneezing, sore throat and trouble swallowing.   Eyes: Negative for itching and visual disturbance.  Respiratory: Negative for cough.   Cardiovascular: Negative for chest pain, palpitations and leg swelling.  Gastrointestinal: Negative for abdominal distention, blood in stool, diarrhea and nausea.  Genitourinary: Negative for frequency and hematuria.  Musculoskeletal: Negative for back pain, gait problem, joint swelling and neck pain.  Skin: Negative for rash.  Neurological: Positive for light-headedness. Negative for dizziness, tremors, speech difficulty and weakness.  Psychiatric/Behavioral: Negative for agitation,  dysphoric mood and sleep disturbance. The patient is not nervous/anxious.     Objective:  BP (!) 156/74 (BP Location: Left Arm, Patient Position: Sitting, Cuff Size: Normal)   Pulse (!) 57   Temp 98 F (36.7 C) (Oral)   Ht 5\' 9"  (1.753 m)   Wt 147 lb (66.7 kg)   SpO2 91%   BMI 21.71 kg/m   BP Readings from Last 3 Encounters:  09/22/19 (!) 156/74  08/05/19 (!) 164/70  07/29/19 (!) 158/70    Wt Readings from Last 3 Encounters:  09/22/19 147 lb (66.7 kg)  08/05/19 142 lb (64.4 kg)  07/29/19 142 lb (64.4 kg)    Physical Exam Constitutional:      General: He is not in acute distress.    Appearance: He is well-developed.     Comments: NAD  Eyes:     Conjunctiva/sclera: Conjunctivae normal.     Pupils: Pupils are equal, round, and reactive to light.  Neck:     Thyroid: No thyromegaly.     Vascular: No JVD.  Cardiovascular:     Rate and Rhythm: Normal rate and regular rhythm.     Heart sounds: Normal heart sounds. No murmur. No friction rub. No gallop.   Pulmonary:     Effort: Pulmonary effort is normal. No respiratory distress.     Breath sounds: Normal breath sounds. No wheezing or rales.  Chest:     Chest wall: No tenderness.  Abdominal:     General: Bowel sounds are normal. There is no distension.     Palpations: Abdomen is  soft. There is no mass.     Tenderness: There is no abdominal tenderness. There is no guarding or rebound.  Musculoskeletal:        General: No tenderness. Normal range of motion.     Cervical back: Normal range of motion.  Lymphadenopathy:     Cervical: No cervical adenopathy.  Skin:    General: Skin is warm and dry.     Findings: No rash.  Neurological:     Mental Status: He is alert and oriented to person, place, and time.     Cranial Nerves: No cranial nerve deficit.     Motor: No abnormal muscle tone.     Coordination: Coordination normal.     Gait: Gait normal.     Deep Tendon Reflexes: Reflexes are normal and symmetric.    Psychiatric:        Behavior: Behavior normal.        Thought Content: Thought content normal.        Judgment: Judgment normal.   slouching   Lab Results  Component Value Date   WBC 4.6 02/11/2019   HGB 12.0 (L) 02/11/2019   HCT 35.6 (L) 02/11/2019   PLT 128.0 (L) 02/11/2019   GLUCOSE 108 (H) 07/29/2019   CHOL 146 05/07/2018   TRIG 85.0 05/07/2018   HDL 56.80 05/07/2018   LDLCALC 72 05/07/2018   ALT 2 02/11/2019   AST 17 02/11/2019   NA 139 07/29/2019   K 4.0 07/29/2019   CL 103 07/29/2019   CREATININE 1.04 07/29/2019   BUN 30 (H) 07/29/2019   CO2 30 07/29/2019   TSH 2.87 02/11/2019   PSA 1.94 05/07/2018    DG Wrist Complete Left  Result Date: 07/24/2019 CLINICAL DATA:  [place] onto outstretched hand today. Left wrist pain. Initial encounter. EXAM: LEFT WRIST - COMPLETE 3+ VIEW COMPARISON:  None. FINDINGS: No evidence of fracture or dislocation. Severe osteoarthritis is seen involving the base of the thumb. Moderate osteoarthritis is also seen involving the radiocarpal joint space. Widening of scapholunate joint is consistent with ligamentous injury. Generalized osteopenia also noted. IMPRESSION: 1. No acute findings. 2. Advanced osteoarthritis of base of thumb and radiocarpal joint space. Electronically Signed   By: Marlaine Hind M.D.   On: 07/24/2019 17:24    Assessment & Plan:   There are no diagnoses linked to this encounter.   No orders of the defined types were placed in this encounter.    Follow-up: No follow-ups on file.  Walker Kehr, MD

## 2019-09-22 NOTE — Assessment & Plan Note (Signed)
Hytrin 

## 2019-09-29 ENCOUNTER — Telehealth: Payer: Self-pay | Admitting: Internal Medicine

## 2019-09-29 NOTE — Telephone Encounter (Signed)
New message:   Pt is calling and states the changes that was made to his medication on his last visit of 09/22/19 are now unaffordable. He states they are now over $400 and would like to know if they can be changed back to the old medications.

## 2019-09-30 NOTE — Telephone Encounter (Signed)
Please advise 

## 2019-10-03 NOTE — Telephone Encounter (Signed)
What Rx is $400? Thx

## 2019-10-05 NOTE — Telephone Encounter (Signed)
Spoke with pt and he said the medication he was talking about is the Olmesartan / HCTZ. The was able to sun it through [facility] for a discounted price.

## 2019-10-06 NOTE — Telephone Encounter (Signed)
Is it affordable now? Thank you

## 2019-10-11 NOTE — Telephone Encounter (Signed)
yes

## 2019-10-27 DIAGNOSIS — L570 Actinic keratosis: Secondary | ICD-10-CM | POA: Diagnosis not present

## 2019-10-27 DIAGNOSIS — L723 Sebaceous cyst: Secondary | ICD-10-CM | POA: Diagnosis not present

## 2019-10-27 DIAGNOSIS — Z85828 Personal history of other malignant neoplasm of skin: Secondary | ICD-10-CM | POA: Diagnosis not present

## 2019-10-27 DIAGNOSIS — D692 Other nonthrombocytopenic purpura: Secondary | ICD-10-CM | POA: Diagnosis not present

## 2019-10-27 DIAGNOSIS — L821 Other seborrheic keratosis: Secondary | ICD-10-CM | POA: Diagnosis not present

## 2020-02-03 ENCOUNTER — Ambulatory Visit (INDEPENDENT_AMBULATORY_CARE_PROVIDER_SITE_OTHER): Payer: Medicare HMO | Admitting: Internal Medicine

## 2020-02-03 ENCOUNTER — Other Ambulatory Visit: Payer: Self-pay

## 2020-02-03 ENCOUNTER — Encounter: Payer: Self-pay | Admitting: Internal Medicine

## 2020-02-03 ENCOUNTER — Ambulatory Visit (INDEPENDENT_AMBULATORY_CARE_PROVIDER_SITE_OTHER): Payer: Medicare HMO

## 2020-02-03 VITALS — BP 140/60 | HR 93 | Temp 98.6°F | Ht 69.0 in | Wt 138.5 lb

## 2020-02-03 DIAGNOSIS — M1611 Unilateral primary osteoarthritis, right hip: Secondary | ICD-10-CM

## 2020-02-03 DIAGNOSIS — M5136 Other intervertebral disc degeneration, lumbar region: Secondary | ICD-10-CM

## 2020-02-03 DIAGNOSIS — M5441 Lumbago with sciatica, right side: Secondary | ICD-10-CM

## 2020-02-03 DIAGNOSIS — M25551 Pain in right hip: Secondary | ICD-10-CM | POA: Diagnosis not present

## 2020-02-03 DIAGNOSIS — M51369 Other intervertebral disc degeneration, lumbar region without mention of lumbar back pain or lower extremity pain: Secondary | ICD-10-CM

## 2020-02-03 DIAGNOSIS — M47816 Spondylosis without myelopathy or radiculopathy, lumbar region: Secondary | ICD-10-CM | POA: Diagnosis not present

## 2020-02-03 LAB — POC URINALSYSI DIPSTICK (AUTOMATED)
Bilirubin, UA: NEGATIVE
Blood, UA: NEGATIVE
Glucose, UA: NEGATIVE
Ketones, UA: NEGATIVE
Leukocytes, UA: NEGATIVE
Nitrite, UA: NEGATIVE
Protein, UA: NEGATIVE
Spec Grav, UA: 1.015 (ref 1.010–1.025)
Urobilinogen, UA: 0.2 E.U./dL
pH, UA: 7.5 (ref 5.0–8.0)

## 2020-02-03 MED ORDER — NABUMETONE 500 MG PO TABS: 500.0000 mg | ORAL_TABLET | Freq: Two times a day (BID) | ORAL | 0 refills | Status: DC | PRN

## 2020-02-03 NOTE — Patient Instructions (Signed)
Degenerative Disk Disease  Degenerative disk disease is a condition caused by changes that occur in the spinal disks as a person ages. Spinal disks are soft and compressible disks located between the bones of your spine (vertebrae). These disks act like shock absorbers. Degenerative disk disease can affect the whole spine. However, the neck and lower back are most often affected. Many changes can occur in the spinal disks with aging, such as:  The spinal disks may dry and shrink.  Small tears may occur in the tough, outer covering of the disk (annulus).  The disk space may become smaller due to loss of water.  Abnormal growths in the bone (spurs) may occur. This can put pressure on the nerve roots exiting the spinal canal, causing pain.  The spinal canal may become narrowed. What are the causes? This condition may be caused by:  Normal degeneration with age.  Injuries.  Certain activities and sports that cause damage. What increases the risk? The following factors may make you more likely to develop this condition:  Being overweight.  Having a family history of degenerative disk disease.  Smoking.  Sudden injury.  Doing work that requires heavy lifting. What are the signs or symptoms? Symptoms of this condition include:  Pain that varies in intensity. Some people have no pain, while others have severe pain. The location of the pain depends on the part of your backbone that is affected. You may have: ? Pain in your neck or arm if a disk in your neck area is affected. ? Pain in your back, buttocks, or legs if a disk in your lower back is affected.  Pain that becomes worse while bending or reaching up, or with twisting movements.  Pain that may start gradually and then get worse as time passes. It may also start after a major or minor injury.  Numbness or tingling in the arms or legs. How is this diagnosed? This condition may be diagnosed based on:  Your symptoms and  medical history.  A physical exam.  Imaging tests, including: ? An X-ray of the spine. ? MRI. How is this treated? This condition may be treated with:  Medicines.  Rehabilitation exercises. These activities aim to strengthen muscles in your back and abdomen to better support your spine. If treatments do not help to relieve your symptoms or you have severe pain, you may need surgery. Follow these instructions at home: Medicines  Take over-the-counter and prescription medicines only as told by your health care provider.  Do not drive or use heavy machinery while taking prescription pain medicine.  If you are taking prescription pain medicine, take actions to prevent or treat constipation. Your health care provider may recommend that you: ? Drink enough fluid to keep your urine pale yellow. ? Eat foods that are high in fiber, such as fresh fruits and vegetables, whole grains, and beans. ? Limit foods that are high in fat and processed sugars, such as fried or sweet foods. ? Take an over-the-counter or prescription medicine for constipation. Activity  Rest as told by your health care provider.  Ask your health care provider what activities are safe for you. Return to your normal activities as directed.  Avoid sitting for a long time without moving. Get up to take short walks every 1-2 hours. This is important to improve blood flow and breathing. Ask for help if you feel weak or unsteady.  Perform relaxation exercises as told by your health care provider.  Maintain good posture.    Do not lift anything that is heavier than 10 lb (4.5 kg), or the limit that you are told, until your health care provider says that it is safe.  Follow proper lifting and walking techniques as told by your health care provider. Managing pain, stiffness, and swelling   If directed, put ice on the painful area. Icing can help to relieve pain. ? Put ice in a plastic bag. ? Place a towel between your  skin and the bag. ? Leave the ice on for 20 minutes, 2-3 times a day.  If directed, apply heat to the painful area as often as told by your health care provider. Heat can reduce the stiffness of your muscles. Use the heat source that your health care provider recommends, such as a moist heat pack or a heating pad. ? Place a towel between your skin and the heat source. ? Leave the heat on for 20-30 minutes. ? Remove the heat if your skin turns bright red. This is especially important if you are unable to feel pain, heat, or cold. You may have a greater risk of getting burned. General instructions  Change your sitting, standing, and sleeping habits as told by your health care provider.  Avoid sitting in the same position for long periods of time. Change positions frequently.  Lose weight or maintain a healthy weight as told by your health care provider.  Do not use any products that contain nicotine or tobacco, such as cigarettes and e-cigarettes. If you need help quitting, ask your health care provider.  Wear supportive footwear.  Keep all follow-up visits as told by your health care provider. This is important. This may include visits for physical therapy. Contact a health care provider if you:  Have pain that does not go away within 1-4 weeks.  Lose your appetite.  Lose weight without trying. Get help right away if you:  Have severe pain.  Notice weakness in your arms, hands, or legs.  Begin to lose control of your bladder or bowel movements.  Have fevers or night sweats. Summary  Degenerative disk disease is a condition caused by changes that occur in the spinal disks as a person ages.  Degenerative disk disease can affect the whole spine. However, the neck and lower back are most often affected.  Take over-the-counter and prescription medicines only as told by your health care provider. This information is not intended to replace advice given to you by your health care  provider. Make sure you discuss any questions you have with your health care provider. Document Revised: 06/05/2017 Document Reviewed: 06/05/2017 [employer]vier Patient Education  2020 [employer]  

## 2020-02-03 NOTE — Progress Notes (Signed)
Subjective:  Patient ID: Joshua Adkins, male    DOB: 02-21-1936  Age: 84 y.o. MRN: [id number]  CC: Back Pain and Osteoarthritis  This visit occurred during the SARS-CoV-2 public health emergency.  Safety protocols were in place, including screening questions prior to the visit, additional usage of staff PPE, and extensive cleaning of exam room while observing appropriate contact time as indicated for disinfecting solutions.    HPI Joshua Adkins presents for concerns about LBP and right hip pain.  The pain started 2 weeks ago after he was doing some yard work but he does not recall any specific trauma or injury.  The pain radiates to the right thigh but he denies lower extremity paresthesias.  He has not gotten much symptom relief with Tylenol.   Outpatient Medications Prior to Visit  Medication Sig Dispense Refill   Ascorbic Acid (VITAMIN C) 1000 MG tablet Take 1,000 mg by mouth every other day. Reported on 09/05/2015     aspirin EC 81 MG tablet Take 81 mg by mouth daily.     atorvastatin (LIPITOR) 10 MG tablet Take 1 tablet by mouth once daily 90 tablet 3   Cholecalciferol 1000 UNITS tablet Take 1,000 Units by mouth every other day.     olmesartan-hydrochlorothiazide (BENICAR HCT) 40-25 MG tablet Take 1 tablet by mouth daily. 90 tablet 3   terazosin (HYTRIN) 2 MG capsule Take 2 mg by mouth at bedtime.     terazosin (HYTRIN) 5 MG capsule Take 1 capsule (5 mg total) by mouth at bedtime. 90 capsule 3   No facility-administered medications prior to visit.    ROS Review of Systems  Constitutional: Negative.  Negative for appetite change, diaphoresis, fatigue and unexpected weight change.  HENT: Negative.   Eyes: Negative.   Respiratory: Negative for chest tightness, shortness of breath and wheezing.   Cardiovascular: Negative for chest pain, palpitations and leg swelling.  Gastrointestinal: Negative for abdominal pain, constipation, diarrhea, nausea and vomiting.  Endocrine:  Negative.   Genitourinary: Negative.  Negative for difficulty urinating.  Musculoskeletal: Positive for arthralgias and back pain.  Skin: Negative for color change and pallor.  Neurological: Negative.  Negative for dizziness, weakness, light-headedness and headaches.  Hematological: Negative for adenopathy. Does not bruise/bleed easily.  Psychiatric/Behavioral: Negative.     Objective:  BP 140/60 (BP Location: Left Arm, Patient Position: Sitting, Cuff Size: Large)    Pulse 93    Temp 98.6 F (37 C) (Oral)    Ht 5\' 9"  (1.753 m)    Wt 138 lb 8 oz (62.8 kg)    SpO2 96%    BMI 20.45 kg/m   BP Readings from Last 3 Encounters:  02/03/20 140/60  09/22/19 (!) 156/74  08/05/19 (!) 164/70    Wt Readings from Last 3 Encounters:  02/03/20 138 lb 8 oz (62.8 kg)  09/22/19 147 lb (66.7 kg)  08/05/19 142 lb (64.4 kg)    Physical Exam Vitals reviewed.  Constitutional:      Appearance: Normal appearance.  HENT:     Nose: Nose normal.     Mouth/Throat:     Mouth: Mucous membranes are moist.  Eyes:     General: No scleral icterus.    Conjunctiva/sclera: Conjunctivae normal.  Cardiovascular:     Rate and Rhythm: Normal rate. Rhythm irregular.     Heart sounds: No murmur heard.   Pulmonary:     Effort: Pulmonary effort is normal.     Breath sounds: No wheezing, rhonchi or  rales.  Abdominal:     General: Abdomen is flat.  Musculoskeletal:        General: Normal range of motion.     Cervical back: Normal and neck supple.     Thoracic back: Normal.     Lumbar back: Deformity present. No spasms, tenderness or bony tenderness. Negative right straight leg raise test and negative left straight leg raise test.     Right hip: Bony tenderness present. No deformity, tenderness or crepitus. Normal range of motion.     Left hip: Normal.     Right lower leg: No edema.     Left lower leg: No edema.  Lymphadenopathy:     Cervical: No cervical adenopathy.  Skin:    General: Skin is warm.    Neurological:     General: No focal deficit present.     Mental Status: He is alert.     Sensory: Sensation is intact.     Motor: Motor function is intact. No weakness or atrophy.     Coordination: Coordination is intact.     Gait: Gait is intact.     Deep Tendon Reflexes:     Reflex Scores:      Tricep reflexes are 0 on the right side and 0 on the left side.      Bicep reflexes are 0 on the right side and 0 on the left side.      Brachioradialis reflexes are 0 on the right side and 0 on the left side.      Patellar reflexes are 0 on the right side and 0 on the left side.      Achilles reflexes are 0 on the right side and 0 on the left side.    Lab Results  Component Value Date   WBC 4.6 02/11/2019   HGB 12.0 (L) 02/11/2019   HCT 35.6 (L) 02/11/2019   PLT 128.0 (L) 02/11/2019   GLUCOSE 108 (H) 07/29/2019   CHOL 146 05/07/2018   TRIG 85.0 05/07/2018   HDL 56.80 05/07/2018   LDLCALC 72 05/07/2018   ALT 2 02/11/2019   AST 17 02/11/2019   NA 139 07/29/2019   K 4.0 07/29/2019   CL 103 07/29/2019   CREATININE 1.04 07/29/2019   BUN 30 (H) 07/29/2019   CO2 30 07/29/2019   TSH 2.87 02/11/2019   PSA 1.94 05/07/2018    DG Wrist Complete Left  Result Date: 07/24/2019 CLINICAL DATA:  [place] onto outstretched hand today. Left wrist pain. Initial encounter. EXAM: LEFT WRIST - COMPLETE 3+ VIEW COMPARISON:  None. FINDINGS: No evidence of fracture or dislocation. Severe osteoarthritis is seen involving the base of the thumb. Moderate osteoarthritis is also seen involving the radiocarpal joint space. Widening of scapholunate joint is consistent with ligamentous injury. Generalized osteopenia also noted. IMPRESSION: 1. No acute findings. 2. Advanced osteoarthritis of base of thumb and radiocarpal joint space. Electronically Signed   By: Marlaine Hind M.D.   On: 07/24/2019 17:24   DG Lumbar Spine Complete  Result Date: 02/03/2020 CLINICAL DATA:  Lower RIGHT-sided hip pain for 1 week. EXAM:  LUMBAR SPINE - COMPLETE 4+ VIEW COMPARISON:  None. FINDINGS: Scoliosis of the lower lumbar spine, mild to moderate in degree. No evidence of acute vertebral body subluxation. No fracture line or displaced fracture fragment. No compression fracture deformity. Degenerative spondylosis of the L2-3 through L4-5 levels, moderate in degree with associated disc space narrowing and osteophyte formation. Additional degenerative facet hypertrophy within the mid and lower  lumbar spine, mild to moderate in degree. Aortic atherosclerosis. Visualized paravertebral soft tissues are otherwise unremarkable. IMPRESSION: 1. No acute findings. 2. Degenerative spondylosis of the lumbar spine, moderate in degree, as detailed above. If symptoms could be radiculopathic in nature, consider MRI to exclude associated nerve root impingement. 3. Mild to moderate scoliosis. 4. Aortic atherosclerosis. Electronically Signed   By: Franki Cabot M.D.   On: 02/03/2020 16:03   DG Hip Unilat W OR W/O Pelvis 2-3 Views Right  Result Date: 02/03/2020 CLINICAL DATA:  Lower RIGHT-sided hip pain for 1 week. EXAM: DG HIP (WITH OR WITHOUT PELVIS) 2-3V RIGHT COMPARISON:  None. FINDINGS: Single-view of the pelvis and two views of the RIGHT hip are provided. Osseous alignment is normal. No fracture line or displaced fracture fragment is seen. No acute or suspicious osseous lesion. Mild degenerative spurring at the RIGHT hip joint. Soft tissues about the pelvis are unremarkable. Atherosclerotic calcifications of the bilateral superficial femoral arteries. Degenerative changes of the lower lumbar spine, at least moderate in degree, incompletely imaged. IMPRESSION: 1. No acute findings. 2. Mild degenerative change at the RIGHT hip joint. 3. Degenerative spondylosis of the lower lumbar spine, at least moderate in degree, incompletely imaged. 4. Atherosclerosis. Electronically Signed   By: Franki Cabot M.D.   On: 02/03/2020 15:55     Assessment & Plan:    Obed was seen today for back pain and osteoarthritis.  Diagnoses and all orders for this visit:  Acute right hip pain -     DG Hip Unilat W OR W/O Pelvis 2-3 Views Right; Future -     POCT Urinalysis Dipstick (Automated)  Acute right-sided low back pain with right-sided sciatica -     DG Lumbar Spine Complete; Future  DDD (degenerative disc disease), lumbar- He has low back pain that radiates into his right thigh.  He is neurologically intact.  Plain films show degenerative changes.  I recommended that he treat this with nabumetone. -     nabumetone (RELAFEN) 500 MG tablet; Take 1 tablet (500 mg total) by mouth 2 (two) times daily as needed.  Primary osteoarthritis of right hip -     nabumetone (RELAFEN) 500 MG tablet; Take 1 tablet (500 mg total) by mouth 2 (two) times daily as needed.   I am having Harvie Heck. Del[place]ico start on nabumetone. I am also having him maintain his vitamin C, aspirin EC, Cholecalciferol, atorvastatin, olmesartan-hydrochlorothiazide, and terazosin.  Meds ordered this encounter  Medications   nabumetone (RELAFEN) 500 MG tablet    Sig: Take 1 tablet (500 mg total) by mouth 2 (two) times daily as needed.    Dispense:  180 tablet    Refill:  0     Follow-up: Return in about 3 months (around 05/05/2020).  Scarlette Calico, MD

## 2020-04-14 DIAGNOSIS — H04123 Dry eye syndrome of bilateral lacrimal glands: Secondary | ICD-10-CM | POA: Diagnosis not present

## 2020-05-22 DIAGNOSIS — H40013 Open angle with borderline findings, low risk, bilateral: Secondary | ICD-10-CM | POA: Diagnosis not present

## 2020-05-22 DIAGNOSIS — H25813 Combined forms of age-related cataract, bilateral: Secondary | ICD-10-CM | POA: Diagnosis not present

## 2020-05-22 DIAGNOSIS — H524 Presbyopia: Secondary | ICD-10-CM | POA: Diagnosis not present

## 2020-05-22 DIAGNOSIS — H52203 Unspecified astigmatism, bilateral: Secondary | ICD-10-CM | POA: Diagnosis not present

## 2020-06-05 ENCOUNTER — Other Ambulatory Visit: Payer: Self-pay | Admitting: Internal Medicine

## 2020-07-18 ENCOUNTER — Other Ambulatory Visit: Payer: Self-pay | Admitting: Internal Medicine

## 2020-08-10 ENCOUNTER — Telehealth: Payer: Self-pay | Admitting: Internal Medicine

## 2020-08-10 NOTE — Telephone Encounter (Signed)
Error

## 2020-08-16 ENCOUNTER — Ambulatory Visit (INDEPENDENT_AMBULATORY_CARE_PROVIDER_SITE_OTHER): Payer: Medicare HMO | Admitting: Internal Medicine

## 2020-08-16 ENCOUNTER — Encounter: Payer: Self-pay | Admitting: Internal Medicine

## 2020-08-16 ENCOUNTER — Other Ambulatory Visit: Payer: Self-pay

## 2020-08-16 VITALS — BP 152/80 | HR 69 | Temp 97.9°F | Wt 140.6 lb

## 2020-08-16 DIAGNOSIS — R0600 Dyspnea, unspecified: Secondary | ICD-10-CM

## 2020-08-16 DIAGNOSIS — I10 Essential (primary) hypertension: Secondary | ICD-10-CM

## 2020-08-16 DIAGNOSIS — R634 Abnormal weight loss: Secondary | ICD-10-CM

## 2020-08-16 DIAGNOSIS — E785 Hyperlipidemia, unspecified: Secondary | ICD-10-CM

## 2020-08-16 DIAGNOSIS — R413 Other amnesia: Secondary | ICD-10-CM

## 2020-08-16 DIAGNOSIS — R0609 Other forms of dyspnea: Secondary | ICD-10-CM

## 2020-08-16 LAB — LIPID PANEL
Cholesterol: 133 mg/dL (ref 0–200)
HDL: 63.7 mg/dL (ref >39.00)
LDL Cholesterol: 58 mg/dL (ref 0–99)
NonHDL: 69.66
Total CHOL/HDL Ratio: 2
Triglycerides: 58 mg/dL (ref 0.0–149.0)
VLDL: 11.6 mg/dL (ref 0.0–40.0)

## 2020-08-16 LAB — COMPREHENSIVE METABOLIC PANEL
ALT: 3 U/L (ref 0–53)
AST: 24 U/L (ref 0–37)
Albumin: 4.2 g/dL (ref 3.5–5.2)
Alkaline Phosphatase: 44 U/L (ref 39–117)
BUN: 39 mg/dL — ABNORMAL HIGH (ref 6–23)
CO2: 31 mEq/L (ref 19–32)
Calcium: 9.6 mg/dL (ref 8.4–10.5)
Chloride: 104 mEq/L (ref 96–112)
Creatinine, Ser: 1.12 mg/dL (ref 0.40–1.50)
GFR: 61.08 mL/min (ref >60.00)
Glucose, Bld: 97 mg/dL (ref 70–99)
Potassium: 4.2 mEq/L (ref 3.5–5.1)
Sodium: 141 mEq/L (ref 135–145)
Total Bilirubin: 0.5 mg/dL (ref 0.2–1.2)
Total Protein: 6.6 g/dL (ref 6.0–8.3)

## 2020-08-16 LAB — CBC WITH DIFFERENTIAL/PLATELET
Basophils Absolute: 0 10*3/uL (ref 0.0–0.1)
Basophils Relative: 0.5 % (ref 0.0–3.0)
Eosinophils Absolute: 0 10*3/uL (ref 0.0–0.7)
Eosinophils Relative: 0 % (ref 0.0–5.0)
HCT: 34.6 % — ABNORMAL LOW (ref 39.0–52.0)
Hemoglobin: 11.8 g/dL — ABNORMAL LOW (ref 13.0–17.0)
Lymphocytes Relative: 33.5 % (ref 12.0–46.0)
Lymphs Abs: 1.3 10*3/uL (ref 0.7–4.0)
MCHC: 34.1 g/dL (ref 30.0–36.0)
MCV: 87.2 fl (ref 78.0–100.0)
Monocytes Absolute: 0.4 10*3/uL (ref 0.1–1.0)
Monocytes Relative: 9.6 % (ref 3.0–12.0)
Neutro Abs: 2.2 10*3/uL (ref 1.4–7.7)
Neutrophils Relative %: 56.4 % (ref 43.0–77.0)
Platelets: 102 10*3/uL — ABNORMAL LOW (ref 150.0–400.0)
RBC: 3.97 Mil/uL — ABNORMAL LOW (ref 4.22–5.81)
RDW: 14.9 % (ref 11.5–15.5)
WBC: 3.9 10*3/uL — ABNORMAL LOW (ref 4.0–10.5)

## 2020-08-16 LAB — URINALYSIS
Bilirubin Urine: NEGATIVE
Hgb urine dipstick: NEGATIVE
Ketones, ur: NEGATIVE
Leukocytes,Ua: NEGATIVE
Nitrite: NEGATIVE
Specific Gravity, Urine: 1.02 (ref 1.000–1.030)
Total Protein, Urine: NEGATIVE
Urine Glucose: NEGATIVE
Urobilinogen, UA: 1 (ref 0.0–1.0)
pH: 7 (ref 5.0–8.0)

## 2020-08-16 LAB — TSH: TSH: 1.66 u[IU]/mL (ref 0.35–4.50)

## 2020-08-16 LAB — PSA: PSA: 1.64 ng/mL (ref 0.10–4.00)

## 2020-08-16 MED ORDER — OLMESARTAN MEDOXOMIL-HCTZ 40-25 MG PO TABS: 1.0000 | ORAL_TABLET | Freq: Every day | ORAL | 3 refills | Status: DC

## 2020-08-16 NOTE — Assessment & Plan Note (Signed)
Mild CBC, CXR

## 2020-08-16 NOTE — Assessment & Plan Note (Signed)
try Lions mane, B complex

## 2020-08-16 NOTE — Patient Instructions (Addendum)
   B-complex     Lion's mane

## 2020-08-16 NOTE — Progress Notes (Signed)
Subjective:  Patient ID: Joshua Adkins, male    DOB: 1935/09/15  Age: 85 y.o. MRN: [id number]  CC: No chief complaint on file.   HPI Joshua Adkins presents for HTN, dyslipidemia, BPH f/u Working 11 h/d Out of Benicar x 2 wks Pt was advised to get vaccinated for [place] memory difficulties  Outpatient Medications Prior to Visit  Medication Sig Dispense Refill  . Ascorbic Acid (VITAMIN C) 1000 MG tablet Take 1,000 mg by mouth every other day. Reported on 09/05/2015    . aspirin EC 81 MG tablet Take 81 mg by mouth daily.    Marland Kitchen atorvastatin (LIPITOR) 10 MG tablet Take 1 tablet by mouth once daily 90 tablet 3  . Cholecalciferol 1000 UNITS tablet Take 1,000 Units by mouth every other day.    . nabumetone (RELAFEN) 500 MG tablet Take 1 tablet (500 mg total) by mouth 2 (two) times daily as needed. 180 tablet 0  . olmesartan-hydrochlorothiazide (BENICAR HCT) 40-25 MG tablet Take 1 tablet by mouth daily. 90 tablet 3  . terazosin (HYTRIN) 2 MG capsule TAKE 1 CAPSULE BY MOUTH EVERY DAY AT BEDTIME 90 capsule 3   No facility-administered medications prior to visit.    ROS: Review of Systems  Constitutional: Positive for fatigue. Negative for appetite change and unexpected weight change.  HENT: Negative for congestion, nosebleeds, sneezing, sore throat and trouble swallowing.   Eyes: Negative for itching and visual disturbance.  Respiratory: Negative for cough.   Cardiovascular: Negative for chest pain, palpitations and leg swelling.  Gastrointestinal: Negative for abdominal distention, blood in stool, diarrhea and nausea.  Genitourinary: Negative for frequency and hematuria.  Musculoskeletal: Negative for back pain, gait problem, joint swelling and neck pain.  Skin: Negative for rash.  Neurological: Negative for dizziness, tremors, speech difficulty and weakness.  Psychiatric/Behavioral: Negative for agitation, dysphoric mood, sleep disturbance and suicidal ideas. The patient is  not nervous/anxious.     Objective:  BP (!) 152/80 (BP Location: Left Arm)   Pulse 69   Temp 97.9 F (36.6 C) (Oral)   Wt 140 lb 9.6 oz (63.8 kg)   SpO2 96%   BMI 20.76 kg/m   BP Readings from Last 3 Encounters:  08/16/20 (!) 152/80  02/03/20 140/60  09/22/19 (!) 156/74    Wt Readings from Last 3 Encounters:  08/16/20 140 lb 9.6 oz (63.8 kg)  02/03/20 138 lb 8 oz (62.8 kg)  09/22/19 147 lb (66.7 kg)    Physical Exam Constitutional:      General: He is not in acute distress.    Appearance: He is well-developed.     Comments: NAD  HENT:     Mouth/Throat:     Mouth: Oropharynx is clear and moist.  Eyes:     Conjunctiva/sclera: Conjunctivae normal.     Pupils: Pupils are equal, round, and reactive to light.  Neck:     Thyroid: No thyromegaly.     Vascular: No JVD.  Cardiovascular:     Rate and Rhythm: Normal rate and regular rhythm.     Pulses: Intact distal pulses.     Heart sounds: Normal heart sounds. No murmur heard. No friction rub. No gallop.   Pulmonary:     Effort: Pulmonary effort is normal. No respiratory distress.     Breath sounds: Normal breath sounds. No wheezing or rales.  Chest:     Chest wall: No tenderness.  Abdominal:     General: Bowel sounds are normal. There is  no distension.     Palpations: Abdomen is soft. There is no mass.     Tenderness: There is no abdominal tenderness. There is no guarding or rebound.  Musculoskeletal:        General: No tenderness or edema. Normal range of motion.     Cervical back: Normal range of motion.  Lymphadenopathy:     Cervical: No cervical adenopathy.  Skin:    General: Skin is warm and dry.     Findings: No rash.  Neurological:     Mental Status: He is alert and oriented to person, place, and time.     Cranial Nerves: No cranial nerve deficit.     Motor: No abnormal muscle tone.     Coordination: He displays a negative Romberg sign. Coordination normal.     Gait: Gait normal.     Deep Tendon  Reflexes: Reflexes are normal and symmetric.  Psychiatric:        Mood and Affect: Mood and affect normal.        Behavior: Behavior normal.        Thought Content: Thought content normal.        Judgment: Judgment normal.     Lab Results  Component Value Date   WBC 4.6 02/11/2019   HGB 12.0 (L) 02/11/2019   HCT 35.6 (L) 02/11/2019   PLT 128.0 (L) 02/11/2019   GLUCOSE 108 (H) 07/29/2019   CHOL 146 05/07/2018   TRIG 85.0 05/07/2018   HDL 56.80 05/07/2018   LDLCALC 72 05/07/2018   ALT 2 02/11/2019   AST 17 02/11/2019   NA 139 07/29/2019   K 4.0 07/29/2019   CL 103 07/29/2019   CREATININE 1.04 07/29/2019   BUN 30 (H) 07/29/2019   CO2 30 07/29/2019   TSH 2.87 02/11/2019   PSA 1.94 05/07/2018    DG Wrist Complete Left  Result Date: 07/24/2019 CLINICAL DATA:  [place] onto outstretched hand today. Left wrist pain. Initial encounter. EXAM: LEFT WRIST - COMPLETE 3+ VIEW COMPARISON:  None. FINDINGS: No evidence of fracture or dislocation. Severe osteoarthritis is seen involving the base of the thumb. Moderate osteoarthritis is also seen involving the radiocarpal joint space. Widening of scapholunate joint is consistent with ligamentous injury. Generalized osteopenia also noted. IMPRESSION: 1. No acute findings. 2. Advanced osteoarthritis of base of thumb and radiocarpal joint space. Electronically Signed   By: Marlaine Hind M.D.   On: 07/24/2019 17:24    Assessment & Plan:    Walker Kehr, MD

## 2020-10-26 DIAGNOSIS — Z85828 Personal history of other malignant neoplasm of skin: Secondary | ICD-10-CM | POA: Diagnosis not present

## 2020-10-26 DIAGNOSIS — L218 Other seborrheic dermatitis: Secondary | ICD-10-CM | POA: Diagnosis not present

## 2020-10-26 DIAGNOSIS — L570 Actinic keratosis: Secondary | ICD-10-CM | POA: Diagnosis not present

## 2020-10-26 DIAGNOSIS — L821 Other seborrheic keratosis: Secondary | ICD-10-CM | POA: Diagnosis not present

## 2020-10-26 DIAGNOSIS — D225 Melanocytic nevi of trunk: Secondary | ICD-10-CM | POA: Diagnosis not present

## 2021-01-30 ENCOUNTER — Ambulatory Visit (INDEPENDENT_AMBULATORY_CARE_PROVIDER_SITE_OTHER): Payer: Medicare HMO | Admitting: Internal Medicine

## 2021-01-30 ENCOUNTER — Other Ambulatory Visit: Payer: Self-pay

## 2021-01-30 ENCOUNTER — Encounter: Payer: Self-pay | Admitting: Internal Medicine

## 2021-01-30 DIAGNOSIS — F09 Unspecified mental disorder due to known physiological condition: Secondary | ICD-10-CM

## 2021-01-30 DIAGNOSIS — Z0000 Encounter for general adult medical examination without abnormal findings: Secondary | ICD-10-CM | POA: Diagnosis not present

## 2021-01-30 DIAGNOSIS — F32A Depression, unspecified: Secondary | ICD-10-CM | POA: Insufficient documentation

## 2021-01-30 DIAGNOSIS — F320 Major depressive disorder, single episode, mild: Secondary | ICD-10-CM | POA: Diagnosis not present

## 2021-01-30 DIAGNOSIS — R634 Abnormal weight loss: Secondary | ICD-10-CM

## 2021-01-30 DIAGNOSIS — R69 Illness, unspecified: Secondary | ICD-10-CM | POA: Diagnosis not present

## 2021-01-30 MED ORDER — PAROXETINE HCL 10 MG PO TABS: 10.0000 mg | ORAL_TABLET | Freq: Every day | ORAL | 5 refills | Status: DC

## 2021-01-30 NOTE — Assessment & Plan Note (Signed)
Worse Start Paroxetine 10 mg at hs

## 2021-01-30 NOTE — Assessment & Plan Note (Signed)
Chronic Check TSH Treat depression

## 2021-01-30 NOTE — Assessment & Plan Note (Signed)
He will try Lion's mane

## 2021-01-30 NOTE — Assessment & Plan Note (Signed)
  We discussed age appropriate health related issues, including available/recomended screening tests and vaccinations. Labs were ordered to be later reviewed . All questions were answered. We discussed one or more of the following - seat belt use, use of sunscreen/sun exposure exercise, fall risk reduction, second hand smoke exposure, firearm use and storage, seat belt use, a need for adhering to healthy diet and exercise. Labs were ordered.  All questions were answered.  The pt was advised to see an [facility] Dr Paulita Fujita ?2013 Pt declined a flu shot, Shingrix

## 2021-01-30 NOTE — Progress Notes (Signed)
Subjective:  Patient ID: Joshua Adkins, male    DOB: Jan 28, 1936  Age: 85 y.o. MRN: [id number]  CC: Annual Exam   HPI Joshua Adkins presents for a well exam He is depressed. C/o wt loss  Outpatient Medications Prior to Visit  Medication Sig Dispense Refill   Ascorbic Acid (VITAMIN C) 1000 MG tablet Take 1,000 mg by mouth every other day. Reported on 09/05/2015     aspirin EC 81 MG tablet Take 81 mg by mouth daily.     atorvastatin (LIPITOR) 10 MG tablet Take 1 tablet by mouth once daily 90 tablet 3   Cholecalciferol 1000 UNITS tablet Take 1,000 Units by mouth every other day.     nabumetone (RELAFEN) 500 MG tablet Take 1 tablet (500 mg total) by mouth 2 (two) times daily as needed. 180 tablet 0   olmesartan-hydrochlorothiazide (BENICAR HCT) 40-25 MG tablet Take 1 tablet by mouth daily. 90 tablet 3   terazosin (HYTRIN) 2 MG capsule TAKE 1 CAPSULE BY MOUTH EVERY DAY AT BEDTIME 90 capsule 3   No facility-administered medications prior to visit.    ROS: Review of Systems  Constitutional:  Positive for fatigue and unexpected weight change. Negative for appetite change.  HENT:  Negative for congestion, nosebleeds, sneezing, sore throat and trouble swallowing.   Eyes:  Negative for itching and visual disturbance.  Respiratory:  Negative for cough.   Cardiovascular:  Negative for chest pain, palpitations and leg swelling.  Gastrointestinal:  Negative for abdominal distention, blood in stool, diarrhea and nausea.  Genitourinary:  Negative for frequency and hematuria.  Musculoskeletal:  Positive for arthralgias and back pain. Negative for gait problem, joint swelling and neck pain.  Skin:  Negative for rash.  Neurological:  Negative for dizziness, tremors, speech difficulty and weakness.  Psychiatric/Behavioral:  Positive for dysphoric mood. Negative for agitation, sleep disturbance and suicidal ideas. The patient is nervous/anxious.    Objective:  BP 140/62 (BP Location: Left Arm)    Pulse 70   Temp 98.2 F (36.8 C) (Oral)   Ht '5\' 9"'$  (1.753 m)   Wt 129 lb 9.6 oz (58.8 kg)   SpO2 97%   BMI 19.14 kg/m   BP Readings from Last 3 Encounters:  01/30/21 140/62  08/16/20 (!) 152/80  02/03/20 140/60    Wt Readings from Last 3 Encounters:  01/30/21 129 lb 9.6 oz (58.8 kg)  08/16/20 140 lb 9.6 oz (63.8 kg)  02/03/20 138 lb 8 oz (62.8 kg)    Physical Exam Constitutional:      General: He is not in acute distress.    Appearance: Normal appearance. He is well-developed.     Comments: NAD  Eyes:     Conjunctiva/sclera: Conjunctivae normal.     Pupils: Pupils are equal, round, and reactive to light.  Neck:     Thyroid: No thyromegaly.     Vascular: No JVD.  Cardiovascular:     Rate and Rhythm: Normal rate and regular rhythm.     Heart sounds: Normal heart sounds. No murmur heard.   No friction rub. No gallop.  Pulmonary:     Effort: Pulmonary effort is normal. No respiratory distress.     Breath sounds: Normal breath sounds. No wheezing or rales.  Chest:     Chest wall: No tenderness.  Abdominal:     General: Bowel sounds are normal. There is no distension.     Palpations: Abdomen is soft. There is no mass.     Tenderness:  There is no abdominal tenderness. There is no guarding or rebound.  Musculoskeletal:        General: No tenderness. Normal range of motion.     Cervical back: Normal range of motion.  Lymphadenopathy:     Cervical: No cervical adenopathy.  Skin:    General: Skin is warm and dry.     Findings: No rash.  Neurological:     Mental Status: He is alert and oriented to person, place, and time.     Cranial Nerves: No cranial nerve deficit.     Motor: No abnormal muscle tone.     Coordination: Coordination normal.     Gait: Gait normal.     Deep Tendon Reflexes: Reflexes are normal and symmetric.  Psychiatric:        Behavior: Behavior normal.        Thought Content: Thought content normal.        Judgment: Judgment normal.  Alert,  cooperative Slight ataxia Thin   Lab Results  Component Value Date   WBC 3.9 (L) 08/16/2020   HGB 11.8 (L) 08/16/2020   HCT 34.6 (L) 08/16/2020   PLT 102.0 (L) 08/16/2020   GLUCOSE 97 08/16/2020   CHOL 133 08/16/2020   TRIG 58.0 08/16/2020   HDL 63.70 08/16/2020   LDLCALC 58 08/16/2020   ALT 3 08/16/2020   AST 24 08/16/2020   NA 141 08/16/2020   K 4.2 08/16/2020   CL 104 08/16/2020   CREATININE 1.12 08/16/2020   BUN 39 (H) 08/16/2020   CO2 31 08/16/2020   TSH 1.66 08/16/2020   PSA 1.64 08/16/2020    DG Wrist Complete Left  Result Date: 07/24/2019 CLINICAL DATA:  [place] onto outstretched hand today. Left wrist pain. Initial encounter. EXAM: LEFT WRIST - COMPLETE 3+ VIEW COMPARISON:  None. FINDINGS: No evidence of fracture or dislocation. Severe osteoarthritis is seen involving the base of the thumb. Moderate osteoarthritis is also seen involving the radiocarpal joint space. Widening of scapholunate joint is consistent with ligamentous injury. Generalized osteopenia also noted. IMPRESSION: 1. No acute findings. 2. Advanced osteoarthritis of base of thumb and radiocarpal joint space. Electronically Signed   By: Marlaine Hind M.D.   On: 07/24/2019 17:24    Assessment & Plan:    Walker Kehr, MD

## 2021-03-02 DIAGNOSIS — I2510 Atherosclerotic heart disease of native coronary artery without angina pectoris: Secondary | ICD-10-CM | POA: Diagnosis not present

## 2021-03-02 DIAGNOSIS — I739 Peripheral vascular disease, unspecified: Secondary | ICD-10-CM | POA: Diagnosis not present

## 2021-03-02 DIAGNOSIS — I951 Orthostatic hypotension: Secondary | ICD-10-CM | POA: Diagnosis not present

## 2021-03-02 DIAGNOSIS — I10 Essential (primary) hypertension: Secondary | ICD-10-CM | POA: Diagnosis not present

## 2021-03-02 DIAGNOSIS — R69 Illness, unspecified: Secondary | ICD-10-CM | POA: Diagnosis not present

## 2021-03-02 DIAGNOSIS — Z008 Encounter for other general examination: Secondary | ICD-10-CM | POA: Diagnosis not present

## 2021-03-02 DIAGNOSIS — E785 Hyperlipidemia, unspecified: Secondary | ICD-10-CM | POA: Diagnosis not present

## 2021-03-02 DIAGNOSIS — K08409 Partial loss of teeth, unspecified cause, unspecified class: Secondary | ICD-10-CM | POA: Diagnosis not present

## 2021-03-02 DIAGNOSIS — K219 Gastro-esophageal reflux disease without esophagitis: Secondary | ICD-10-CM | POA: Diagnosis not present

## 2021-03-02 DIAGNOSIS — G8929 Other chronic pain: Secondary | ICD-10-CM | POA: Diagnosis not present

## 2021-03-12 ENCOUNTER — Other Ambulatory Visit: Payer: Self-pay

## 2021-03-13 ENCOUNTER — Ambulatory Visit (INDEPENDENT_AMBULATORY_CARE_PROVIDER_SITE_OTHER): Payer: Medicare HMO | Admitting: Internal Medicine

## 2021-03-13 ENCOUNTER — Encounter: Payer: Self-pay | Admitting: Internal Medicine

## 2021-03-13 DIAGNOSIS — F320 Major depressive disorder, single episode, mild: Secondary | ICD-10-CM | POA: Diagnosis not present

## 2021-03-13 DIAGNOSIS — F09 Unspecified mental disorder due to known physiological condition: Secondary | ICD-10-CM | POA: Diagnosis not present

## 2021-03-13 DIAGNOSIS — R634 Abnormal weight loss: Secondary | ICD-10-CM | POA: Diagnosis not present

## 2021-03-13 DIAGNOSIS — R69 Illness, unspecified: Secondary | ICD-10-CM | POA: Diagnosis not present

## 2021-03-13 NOTE — Assessment & Plan Note (Signed)
Feeling better on Paroxetine. He started to put wt back on. Cont on Rx

## 2021-03-13 NOTE — Assessment & Plan Note (Signed)
Memory is better on Paroxetine. Cont on Rx: Paroxetine 10 mg at hs

## 2021-03-13 NOTE — Assessment & Plan Note (Signed)
Feeling better on Paroxetine. He started to put wt back on. Cont on Rx: Paroxetine 10 mg at hs

## 2021-03-13 NOTE — Progress Notes (Signed)
Subjective:  Patient ID: Joshua Adkins, male    DOB: 1936-01-16  Age: 85 y.o. MRN: [id number]  CC: Follow-up (6 week f/u)   HPI Joshua Adkins presents for anxiety, depression, wt loss Feeling better on Paroxetine, resting better. He started to put wt back on.  Outpatient Medications Prior to Visit  Medication Sig Dispense Refill   Ascorbic Acid (VITAMIN C) 1000 MG tablet Take 1,000 mg by mouth every other day. Reported on 09/05/2015     aspirin EC 81 MG tablet Take 81 mg by mouth daily.     atorvastatin (LIPITOR) 10 MG tablet Take 1 tablet by mouth once daily 90 tablet 3   Cholecalciferol 1000 UNITS tablet Take 1,000 Units by mouth every other day.     nabumetone (RELAFEN) 500 MG tablet Take 1 tablet (500 mg total) by mouth 2 (two) times daily as needed. 180 tablet 0   olmesartan-hydrochlorothiazide (BENICAR HCT) 40-25 MG tablet Take 1 tablet by mouth daily. 90 tablet 3   PARoxetine (PAXIL) 10 MG tablet Take 1 tablet (10 mg total) by mouth daily. 30 tablet 5   terazosin (HYTRIN) 2 MG capsule TAKE 1 CAPSULE BY MOUTH EVERY DAY AT BEDTIME 90 capsule 3   No facility-administered medications prior to visit.    ROS: Review of Systems  Constitutional:  Positive for fatigue. Negative for appetite change and unexpected weight change.  HENT:  Negative for congestion, nosebleeds, sneezing, sore throat and trouble swallowing.   Eyes:  Negative for itching and visual disturbance.  Respiratory:  Negative for cough.   Cardiovascular:  Negative for chest pain, palpitations and leg swelling.  Gastrointestinal:  Negative for abdominal distention, blood in stool, diarrhea and nausea.  Genitourinary:  Negative for frequency and hematuria.  Musculoskeletal:  Negative for back pain, gait problem, joint swelling and neck pain.  Skin:  Negative for rash.  Neurological:  Negative for dizziness, tremors, speech difficulty and weakness.  Psychiatric/Behavioral:  Negative for agitation, dysphoric mood,  sleep disturbance and suicidal ideas. The patient is nervous/anxious.    Objective:  BP (!) 132/58 (BP Location: Left Arm)   Pulse 64   Temp 98.2 F (36.8 C) (Oral)   Ht 5\' 9"  (1.753 m)   Wt 133 lb 3.2 oz (60.4 kg)   SpO2 98%   BMI 19.67 kg/m   BP Readings from Last 3 Encounters:  03/13/21 (!) 132/58  01/30/21 140/62  08/16/20 (!) 152/80    Wt Readings from Last 3 Encounters:  03/13/21 133 lb 3.2 oz (60.4 kg)  01/30/21 129 lb 9.6 oz (58.8 kg)  08/16/20 140 lb 9.6 oz (63.8 kg)    Physical Exam Constitutional:      General: He is not in acute distress.    Appearance: He is well-developed.     Comments: NAD  Eyes:     Conjunctiva/sclera: Conjunctivae normal.     Pupils: Pupils are equal, round, and reactive to light.  Neck:     Thyroid: No thyromegaly.     Vascular: No JVD.  Cardiovascular:     Rate and Rhythm: Normal rate and regular rhythm.     Heart sounds: Normal heart sounds. No murmur heard.   No friction rub. No gallop.  Pulmonary:     Effort: Pulmonary effort is normal. No respiratory distress.     Breath sounds: Normal breath sounds. No wheezing or rales.  Chest:     Chest wall: No tenderness.  Abdominal:     General: Bowel sounds  are normal. There is no distension.     Palpations: Abdomen is soft. There is no mass.     Tenderness: There is no abdominal tenderness. There is no guarding or rebound.  Musculoskeletal:        General: No tenderness. Normal range of motion.     Cervical back: Normal range of motion.  Lymphadenopathy:     Cervical: No cervical adenopathy.  Skin:    General: Skin is warm and dry.     Findings: No rash.  Neurological:     Mental Status: He is alert and oriented to person, place, and time.     Cranial Nerves: No cranial nerve deficit.     Motor: No abnormal muscle tone.     Coordination: Coordination abnormal.     Gait: Gait normal.     Deep Tendon Reflexes: Reflexes are normal and symmetric.  Psychiatric:         Behavior: Behavior normal.        Thought Content: Thought content normal.        Judgment: Judgment normal.    Lab Results  Component Value Date   WBC 3.9 (L) 08/16/2020   HGB 11.8 (L) 08/16/2020   HCT 34.6 (L) 08/16/2020   PLT 102.0 (L) 08/16/2020   GLUCOSE 97 08/16/2020   CHOL 133 08/16/2020   TRIG 58.0 08/16/2020   HDL 63.70 08/16/2020   LDLCALC 58 08/16/2020   ALT 3 08/16/2020   AST 24 08/16/2020   NA 141 08/16/2020   K 4.2 08/16/2020   CL 104 08/16/2020   CREATININE 1.12 08/16/2020   BUN 39 (H) 08/16/2020   CO2 31 08/16/2020   TSH 1.66 08/16/2020   PSA 1.64 08/16/2020    DG Wrist Complete Left  Result Date: 07/24/2019 CLINICAL DATA:  [place] onto outstretched hand today. Left wrist pain. Initial encounter. EXAM: LEFT WRIST - COMPLETE 3+ VIEW COMPARISON:  None. FINDINGS: No evidence of fracture or dislocation. Severe osteoarthritis is seen involving the base of the thumb. Moderate osteoarthritis is also seen involving the radiocarpal joint space. Widening of scapholunate joint is consistent with ligamentous injury. Generalized osteopenia also noted. IMPRESSION: 1. No acute findings. 2. Advanced osteoarthritis of base of thumb and radiocarpal joint space. Electronically Signed   By: Marlaine Hind M.D.   On: 07/24/2019 17:24    Assessment & Plan:   Problem List Items Addressed This Visit     Depression    Feeling better on Paroxetine. He started to put wt back on. Cont on Rx: Paroxetine 10 mg at hs      Mild cognitive disorder    Memory is better on Paroxetine. Cont on Rx: Paroxetine 10 mg at hs      Weight loss    Feeling better on Paroxetine. He started to put wt back on. Cont on Rx         Follow-up: No follow-ups on file.  Walker Kehr, MD

## 2021-04-03 IMAGING — DX DG WRIST COMPLETE 3+V*L*
4 series · 4 of 4 positions shown · non-contrast
Comparison: None.

CLINICAL DATA: Fell onto outstretched hand today. Left wrist pain.
Initial encounter.

EXAM:
LEFT WRIST - COMPLETE 3+ VIEW

[wrist pa]
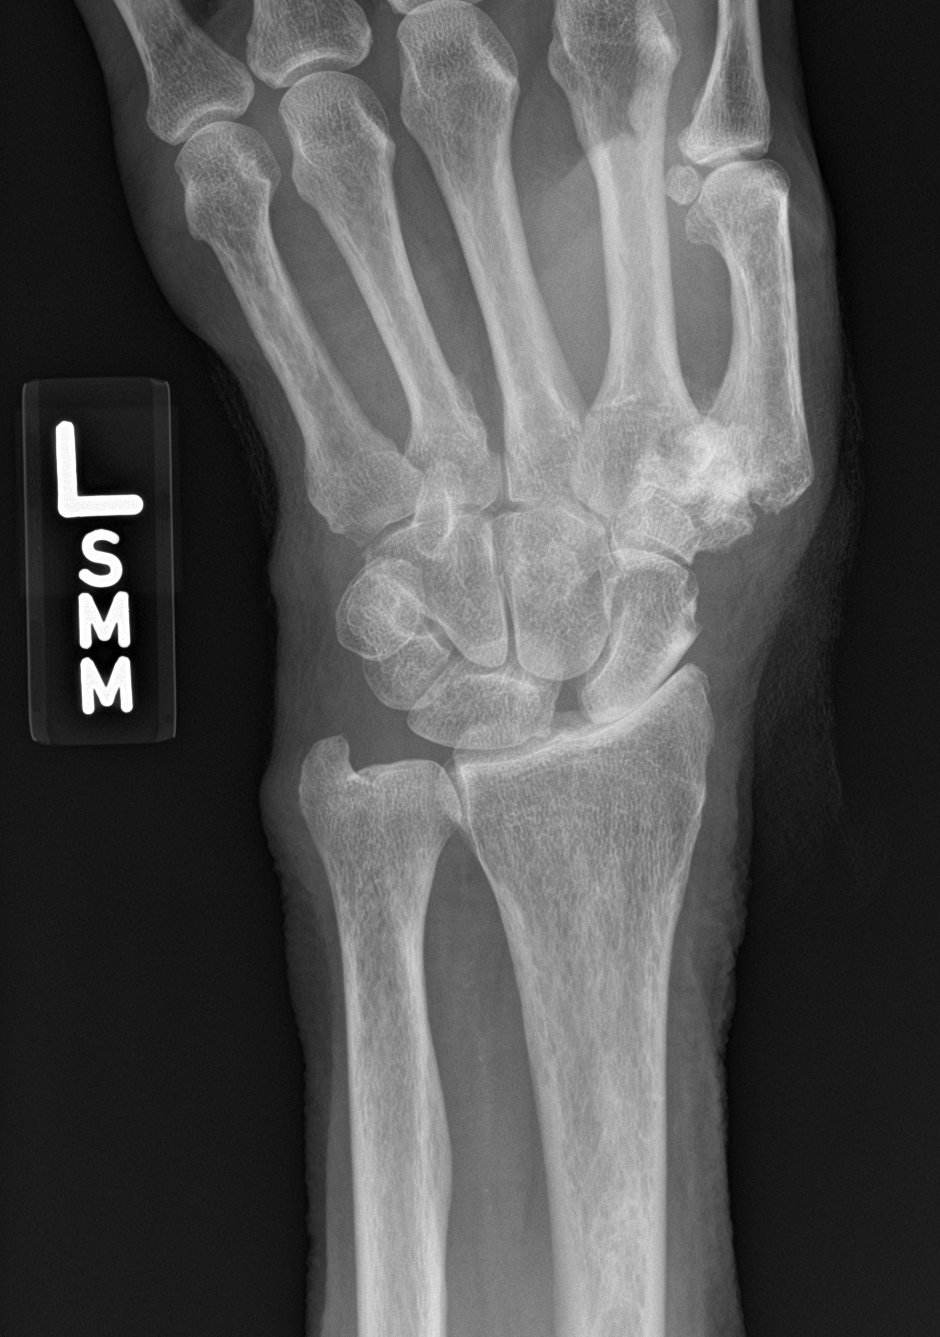

[wrist navicular]
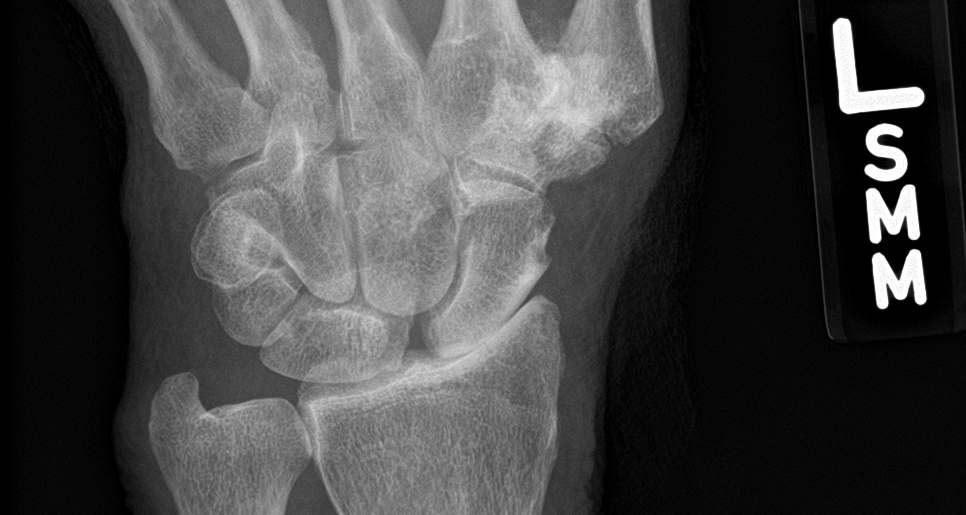

[wrist obl]
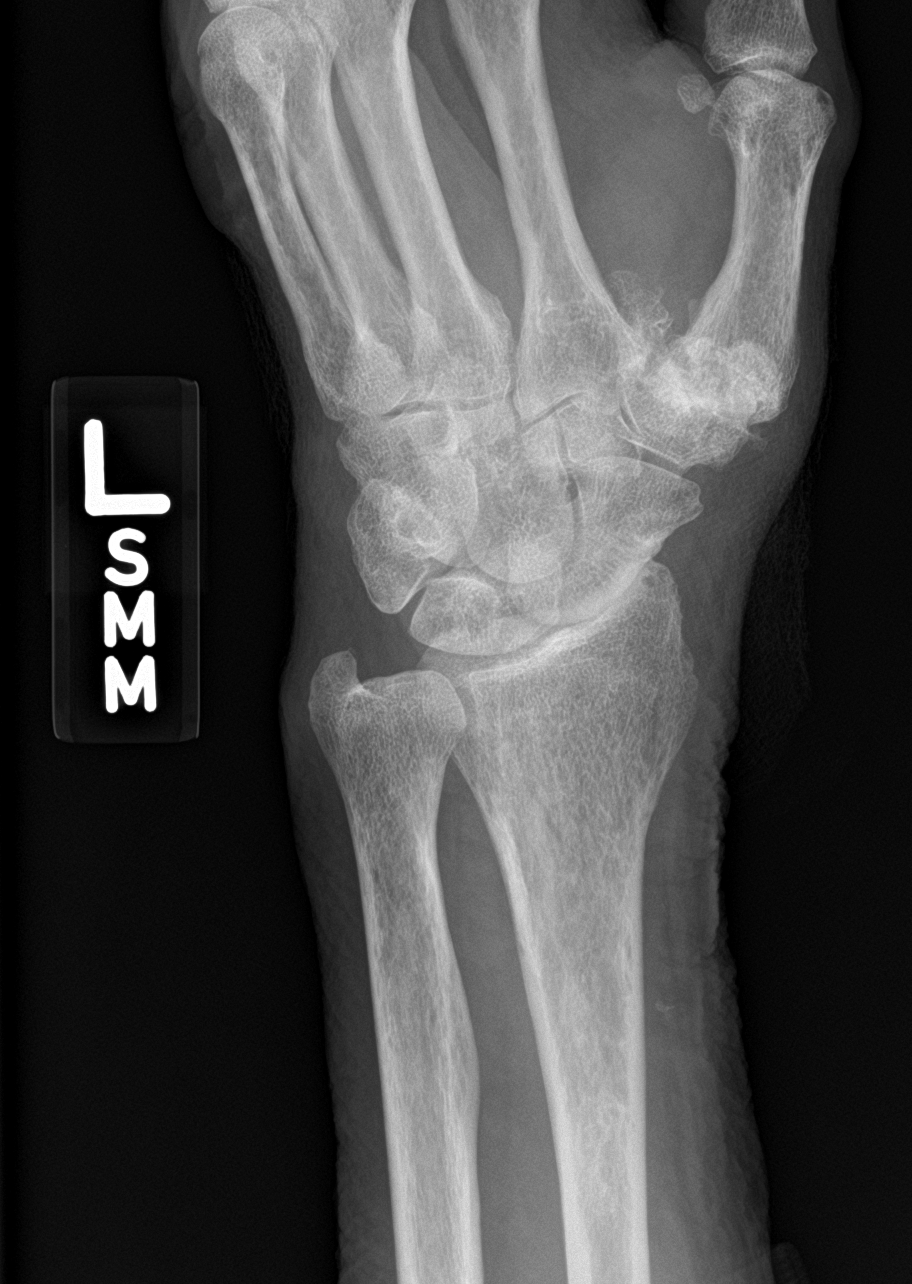

[wrist lat]
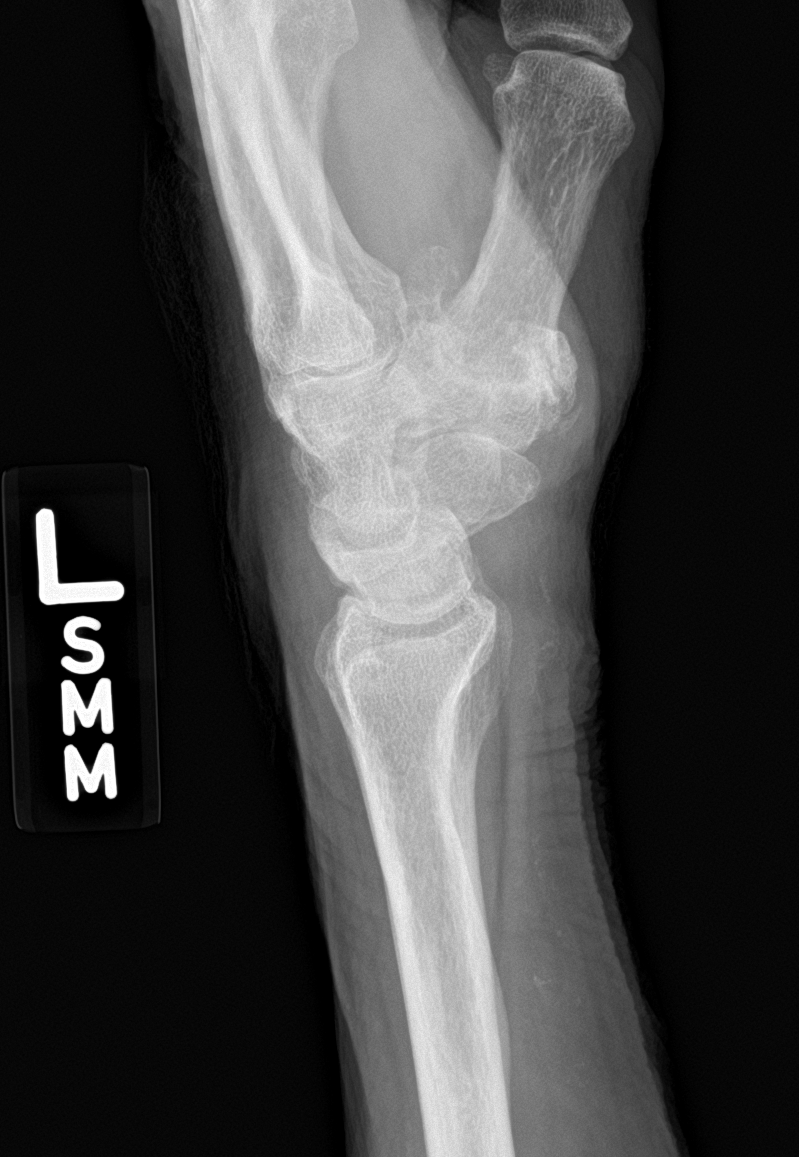

[4 of 4 positions shown; findings below may reference images not displayed]

FINDINGS: No evidence of fracture or dislocation.

Severe osteoarthritis is seen involving the base of the thumb.
Moderate osteoarthritis is also seen involving the radiocarpal joint
space. Widening of scapholunate joint is consistent with ligamentous
injury. Generalized osteopenia also noted.
IMPRESSION: 1. No acute findings.
2. Advanced osteoarthritis of base of thumb and radiocarpal joint
space.

## 2021-04-17 DIAGNOSIS — H40013 Open angle with borderline findings, low risk, bilateral: Secondary | ICD-10-CM | POA: Diagnosis not present

## 2021-04-17 DIAGNOSIS — H2513 Age-related nuclear cataract, bilateral: Secondary | ICD-10-CM | POA: Diagnosis not present

## 2021-04-17 DIAGNOSIS — H5203 Hypermetropia, bilateral: Secondary | ICD-10-CM | POA: Diagnosis not present

## 2021-04-17 DIAGNOSIS — H52203 Unspecified astigmatism, bilateral: Secondary | ICD-10-CM | POA: Diagnosis not present

## 2021-04-17 DIAGNOSIS — H524 Presbyopia: Secondary | ICD-10-CM | POA: Diagnosis not present

## 2021-04-17 DIAGNOSIS — H25013 Cortical age-related cataract, bilateral: Secondary | ICD-10-CM | POA: Diagnosis not present

## 2021-05-09 DIAGNOSIS — H25012 Cortical age-related cataract, left eye: Secondary | ICD-10-CM | POA: Diagnosis not present

## 2021-05-09 DIAGNOSIS — H2511 Age-related nuclear cataract, right eye: Secondary | ICD-10-CM | POA: Diagnosis not present

## 2021-05-09 DIAGNOSIS — H2512 Age-related nuclear cataract, left eye: Secondary | ICD-10-CM | POA: Diagnosis not present

## 2021-05-09 DIAGNOSIS — H25011 Cortical age-related cataract, right eye: Secondary | ICD-10-CM | POA: Diagnosis not present

## 2021-05-16 DIAGNOSIS — H2511 Age-related nuclear cataract, right eye: Secondary | ICD-10-CM | POA: Diagnosis not present

## 2021-06-27 DIAGNOSIS — R32 Unspecified urinary incontinence: Secondary | ICD-10-CM | POA: Diagnosis not present

## 2021-06-27 DIAGNOSIS — I10 Essential (primary) hypertension: Secondary | ICD-10-CM | POA: Diagnosis not present

## 2021-06-27 DIAGNOSIS — M1990 Unspecified osteoarthritis, unspecified site: Secondary | ICD-10-CM | POA: Diagnosis not present

## 2021-06-27 DIAGNOSIS — N529 Male erectile dysfunction, unspecified: Secondary | ICD-10-CM | POA: Diagnosis not present

## 2021-06-27 DIAGNOSIS — K219 Gastro-esophageal reflux disease without esophagitis: Secondary | ICD-10-CM | POA: Diagnosis not present

## 2021-06-27 DIAGNOSIS — R69 Illness, unspecified: Secondary | ICD-10-CM | POA: Diagnosis not present

## 2021-06-27 DIAGNOSIS — Z7982 Long term (current) use of aspirin: Secondary | ICD-10-CM | POA: Diagnosis not present

## 2021-06-27 DIAGNOSIS — I700 Atherosclerosis of aorta: Secondary | ICD-10-CM | POA: Diagnosis not present

## 2021-06-27 DIAGNOSIS — I739 Peripheral vascular disease, unspecified: Secondary | ICD-10-CM | POA: Diagnosis not present

## 2021-06-27 DIAGNOSIS — I2510 Atherosclerotic heart disease of native coronary artery without angina pectoris: Secondary | ICD-10-CM | POA: Diagnosis not present

## 2021-06-27 DIAGNOSIS — E785 Hyperlipidemia, unspecified: Secondary | ICD-10-CM | POA: Diagnosis not present

## 2021-07-02 ENCOUNTER — Other Ambulatory Visit: Payer: Self-pay | Admitting: Internal Medicine

## 2021-07-20 ENCOUNTER — Other Ambulatory Visit: Payer: Self-pay | Admitting: Internal Medicine

## 2021-07-26 DIAGNOSIS — H04123 Dry eye syndrome of bilateral lacrimal glands: Secondary | ICD-10-CM | POA: Diagnosis not present

## 2021-08-07 ENCOUNTER — Other Ambulatory Visit: Payer: Self-pay

## 2021-08-07 ENCOUNTER — Encounter: Payer: Self-pay | Admitting: Internal Medicine

## 2021-08-07 ENCOUNTER — Ambulatory Visit (INDEPENDENT_AMBULATORY_CARE_PROVIDER_SITE_OTHER): Payer: Medicare HMO | Admitting: Internal Medicine

## 2021-08-07 DIAGNOSIS — I10 Essential (primary) hypertension: Secondary | ICD-10-CM

## 2021-08-07 DIAGNOSIS — R413 Other amnesia: Secondary | ICD-10-CM | POA: Diagnosis not present

## 2021-08-07 DIAGNOSIS — R634 Abnormal weight loss: Secondary | ICD-10-CM

## 2021-08-07 DIAGNOSIS — F320 Major depressive disorder, single episode, mild: Secondary | ICD-10-CM

## 2021-08-07 DIAGNOSIS — R69 Illness, unspecified: Secondary | ICD-10-CM | POA: Diagnosis not present

## 2021-08-07 MED ORDER — DONEPEZIL HCL 5 MG PO TABS: 5.0000 mg | ORAL_TABLET | Freq: Every day | ORAL | 3 refills | Status: DC

## 2021-08-07 NOTE — Assessment & Plan Note (Signed)
Worse Start Aricept Chewable vitamins

## 2021-08-07 NOTE — Progress Notes (Signed)
Subjective:  Patient ID: Joshua Adkins, male    DOB: 12-13-1935  Age: 86 y.o. MRN: [id number]  CC: No chief complaint on file.   HPI RENN DIROCCO presents for L elbow bursa discomfort, dyslipidemia, depression f/u He is here with his son-in-law who helps with history  Outpatient Medications Prior to Visit  Medication Sig Dispense Refill   Ascorbic Acid (VITAMIN C) 1000 MG tablet Take 1,000 mg by mouth every other day. Reported on 09/05/2015     aspirin EC 81 MG tablet Take 81 mg by mouth daily.     atorvastatin (LIPITOR) 10 MG tablet Take 1 tablet by mouth once daily 90 tablet 1   Cholecalciferol 1000 UNITS tablet Take 1,000 Units by mouth every other day.     olmesartan-hydrochlorothiazide (BENICAR HCT) 40-25 MG tablet Take 1 tablet by mouth once daily 90 tablet 1   PARoxetine (PAXIL) 10 MG tablet Take 1 tablet (10 mg total) by mouth daily. 30 tablet 5   nabumetone (RELAFEN) 500 MG tablet Take 1 tablet (500 mg total) by mouth 2 (two) times daily as needed. 180 tablet 0   terazosin (HYTRIN) 2 MG capsule TAKE 1 CAPSULE BY MOUTH EVERY DAY AT BEDTIME 90 capsule 3   No facility-administered medications prior to visit.    ROS: Review of Systems  Constitutional:  Negative for appetite change, fatigue and unexpected weight change.  HENT:  Negative for congestion, nosebleeds, sneezing, sore throat and trouble swallowing.   Eyes:  Negative for itching and visual disturbance.  Respiratory:  Negative for cough.   Cardiovascular:  Negative for chest pain, palpitations and leg swelling.  Gastrointestinal:  Negative for abdominal distention, blood in stool, diarrhea and nausea.  Genitourinary:  Negative for frequency and hematuria.  Musculoskeletal:  Positive for arthralgias and gait problem. Negative for back pain, joint swelling and neck pain.  Skin:  Negative for rash.  Neurological:  Negative for dizziness, tremors, speech difficulty and weakness.  Psychiatric/Behavioral:  Negative  for agitation, dysphoric mood and sleep disturbance. The patient is nervous/anxious.    Objective:  BP (P) 132/70 (BP Location: Left Arm, Patient Position: Sitting, Cuff Size: Large)    Pulse (!) (P) 32    Temp (P) 98.3 F (36.8 C) (Oral)    Ht 5\' 9"  (1.753 m)    Wt (P) 142 lb (64.4 kg)    SpO2 (P) 98%    BMI (P) 20.97 kg/m   BP Readings from Last 3 Encounters:  08/07/21 (P) 132/70  03/13/21 (!) 132/58  01/30/21 140/62    Wt Readings from Last 3 Encounters:  08/07/21 (P) 142 lb (64.4 kg)  03/13/21 133 lb 3.2 oz (60.4 kg)  01/30/21 129 lb 9.6 oz (58.8 kg)    Physical Exam Constitutional:      General: He is not in acute distress.    Appearance: He is well-developed.     Comments: NAD  Eyes:     Conjunctiva/sclera: Conjunctivae normal.     Pupils: Pupils are equal, round, and reactive to light.  Neck:     Thyroid: No thyromegaly.     Vascular: No JVD.  Cardiovascular:     Rate and Rhythm: Normal rate and regular rhythm.     Heart sounds: Normal heart sounds. No murmur heard.   No friction rub. No gallop.  Pulmonary:     Effort: Pulmonary effort is normal. No respiratory distress.     Breath sounds: Normal breath sounds. No wheezing or rales.  Chest:  Chest wall: No tenderness.  Abdominal:     General: Bowel sounds are normal. There is no distension.     Palpations: Abdomen is soft. There is no mass.     Tenderness: There is no abdominal tenderness. There is no guarding or rebound.  Musculoskeletal:        General: No tenderness. Normal range of motion.     Cervical back: Normal range of motion.  Lymphadenopathy:     Cervical: No cervical adenopathy.  Skin:    General: Skin is warm and dry.     Findings: No rash.  Neurological:     Mental Status: He is alert and oriented to person, place, and time.     Cranial Nerves: No cranial nerve deficit.     Motor: No abnormal muscle tone.     Coordination: Coordination abnormal.     Gait: Gait abnormal.     Deep Tendon  Reflexes: Reflexes are normal and symmetric.  Psychiatric:        Behavior: Behavior normal.        Thought Content: Thought content normal.        Judgment: Judgment normal.    Lab Results  Component Value Date   WBC 3.9 (L) 08/16/2020   HGB 11.8 (L) 08/16/2020   HCT 34.6 (L) 08/16/2020   PLT 102.0 (L) 08/16/2020   GLUCOSE 97 08/16/2020   CHOL 133 08/16/2020   TRIG 58.0 08/16/2020   HDL 63.70 08/16/2020   LDLCALC 58 08/16/2020   ALT 3 08/16/2020   AST 24 08/16/2020   NA 141 08/16/2020   K 4.2 08/16/2020   CL 104 08/16/2020   CREATININE 1.12 08/16/2020   BUN 39 (H) 08/16/2020   CO2 31 08/16/2020   TSH 1.66 08/16/2020   PSA 1.64 08/16/2020    DG Wrist Complete Left  Result Date: 07/24/2019 CLINICAL DATA:  [place] onto outstretched hand today. Left wrist pain. Initial encounter. EXAM: LEFT WRIST - COMPLETE 3+ VIEW COMPARISON:  None. FINDINGS: No evidence of fracture or dislocation. Severe osteoarthritis is seen involving the base of the thumb. Moderate osteoarthritis is also seen involving the radiocarpal joint space. Widening of scapholunate joint is consistent with ligamentous injury. Generalized osteopenia also noted. IMPRESSION: 1. No acute findings. 2. Advanced osteoarthritis of base of thumb and radiocarpal joint space. Electronically Signed   By: Marlaine Hind M.D.   On: 07/24/2019 17:24    Assessment & Plan:   Problem List Items Addressed This Visit     Depression    Feeling better on Paroxetine. He started to put wt back on. Cont on Rx: Paroxetine 10 mg at hs      Essential hypertension    On Hytrin 5 mg      Memory change    Worse Start Aricept Chewable vitamins      Weight loss    Wt Readings from Last 3 Encounters:  08/07/21 (P) 142 lb (64.4 kg)  03/13/21 133 lb 3.2 oz (60.4 kg)  01/30/21 129 lb 9.6 oz (58.8 kg)  Resolved          Meds ordered this encounter  Medications   donepezil (ARICEPT) 5 MG tablet    Sig: Take 1 tablet (5 mg total) by  mouth at bedtime.    Dispense:  90 tablet    Refill:  3      Follow-up: Return in about 3 months (around 11/04/2021) for a follow-up visit.  Walker Kehr, MD

## 2021-08-07 NOTE — Assessment & Plan Note (Signed)
On Hytrin 5 mg

## 2021-08-07 NOTE — Assessment & Plan Note (Signed)
Wt Readings from Last 3 Encounters:  08/07/21 (P) 142 lb (64.4 kg)  03/13/21 133 lb 3.2 oz (60.4 kg)  01/30/21 129 lb 9.6 oz (58.8 kg)  Resolved

## 2021-08-07 NOTE — Assessment & Plan Note (Signed)
Feeling better on Paroxetine. He started to put wt back on. Cont on Rx: Paroxetine 10 mg at hs

## 2021-08-19 ENCOUNTER — Other Ambulatory Visit: Payer: Self-pay | Admitting: Internal Medicine

## 2021-09-10 DIAGNOSIS — Z961 Presence of intraocular lens: Secondary | ICD-10-CM | POA: Diagnosis not present

## 2021-09-18 ENCOUNTER — Other Ambulatory Visit: Payer: Self-pay | Admitting: Internal Medicine

## 2021-11-05 ENCOUNTER — Ambulatory Visit: Payer: Medicare HMO | Admitting: Internal Medicine

## 2021-11-12 ENCOUNTER — Encounter: Payer: Self-pay | Admitting: Internal Medicine

## 2021-11-12 ENCOUNTER — Ambulatory Visit (INDEPENDENT_AMBULATORY_CARE_PROVIDER_SITE_OTHER): Payer: Medicare HMO | Admitting: Internal Medicine

## 2021-11-12 DIAGNOSIS — F09 Unspecified mental disorder due to known physiological condition: Secondary | ICD-10-CM

## 2021-11-12 DIAGNOSIS — H906 Mixed conductive and sensorineural hearing loss, bilateral: Secondary | ICD-10-CM | POA: Diagnosis not present

## 2021-11-12 DIAGNOSIS — F4321 Adjustment disorder with depressed mood: Secondary | ICD-10-CM

## 2021-11-12 DIAGNOSIS — F320 Major depressive disorder, single episode, mild: Secondary | ICD-10-CM

## 2021-11-12 DIAGNOSIS — R69 Illness, unspecified: Secondary | ICD-10-CM | POA: Diagnosis not present

## 2021-11-12 DIAGNOSIS — R634 Abnormal weight loss: Secondary | ICD-10-CM

## 2021-11-12 LAB — COMPREHENSIVE METABOLIC PANEL
ALT: 3 U/L (ref 0–53)
AST: 22 U/L (ref 0–37)
Albumin: 4.2 g/dL (ref 3.5–5.2)
Alkaline Phosphatase: 40 U/L (ref 39–117)
BUN: 36 mg/dL — ABNORMAL HIGH (ref 6–23)
CO2: 29 mEq/L (ref 19–32)
Calcium: 9.5 mg/dL (ref 8.4–10.5)
Chloride: 107 mEq/L (ref 96–112)
Creatinine, Ser: 1.16 mg/dL (ref 0.40–1.50)
GFR: 58.05 mL/min — ABNORMAL LOW (ref >60.00)
Glucose, Bld: 99 mg/dL (ref 70–99)
Potassium: 4.5 mEq/L (ref 3.5–5.1)
Sodium: 141 mEq/L (ref 135–145)
Total Bilirubin: 0.5 mg/dL (ref 0.2–1.2)
Total Protein: 6.9 g/dL (ref 6.0–8.3)

## 2021-11-12 LAB — CBC WITH DIFFERENTIAL/PLATELET
Basophils Absolute: 0 10*3/uL (ref 0.0–0.1)
Basophils Relative: 0.4 % (ref 0.0–3.0)
Eosinophils Absolute: 0 10*3/uL (ref 0.0–0.7)
Eosinophils Relative: 0 % (ref 0.0–5.0)
HCT: 32.6 % — ABNORMAL LOW (ref 39.0–52.0)
Hemoglobin: 11 g/dL — ABNORMAL LOW (ref 13.0–17.0)
Lymphocytes Relative: 35.6 % (ref 12.0–46.0)
Lymphs Abs: 1.6 10*3/uL (ref 0.7–4.0)
MCHC: 33.9 g/dL (ref 30.0–36.0)
MCV: 87.1 fl (ref 78.0–100.0)
Monocytes Absolute: 0.5 10*3/uL (ref 0.1–1.0)
Monocytes Relative: 11.3 % (ref 3.0–12.0)
Neutro Abs: 2.4 10*3/uL (ref 1.4–7.7)
Neutrophils Relative %: 52.7 % (ref 43.0–77.0)
Platelets: 106 10*3/uL — ABNORMAL LOW (ref 150.0–400.0)
RBC: 3.74 Mil/uL — ABNORMAL LOW (ref 4.22–5.81)
RDW: 15.7 % — ABNORMAL HIGH (ref 11.5–15.5)
WBC: 4.5 10*3/uL (ref 4.0–10.5)

## 2021-11-12 LAB — TSH: TSH: 2.25 u[IU]/mL (ref 0.35–5.50)

## 2021-11-12 MED ORDER — ATORVASTATIN CALCIUM 10 MG PO TABS: 10.0000 mg | ORAL_TABLET | Freq: Every day | ORAL | 3 refills | Status: DC

## 2021-11-12 MED ORDER — DONEPEZIL HCL 5 MG PO TABS: 5.0000 mg | ORAL_TABLET | Freq: Every day | ORAL | 3 refills | Status: DC

## 2021-11-12 MED ORDER — PAROXETINE HCL 10 MG PO TABS: 10.0000 mg | ORAL_TABLET | Freq: Every day | ORAL | 1 refills | Status: DC

## 2021-11-12 MED ORDER — TERAZOSIN HCL 2 MG PO CAPS: ORAL_CAPSULE | ORAL | 3 refills | Status: DC

## 2021-11-12 MED ORDER — OLMESARTAN MEDOXOMIL-HCTZ 40-25 MG PO TABS: 1.0000 | ORAL_TABLET | Freq: Every day | ORAL | 3 refills | Status: DC

## 2021-11-12 NOTE — Patient Instructions (Signed)
[facility] [employer] ([address] [place], [place] [address] [phone]

## 2021-11-12 NOTE — Assessment & Plan Note (Addendum)
Worse Get hearing aids Cont w/Aricept

## 2021-11-12 NOTE — Assessment & Plan Note (Addendum)
Worse Go to [facility] to get hearing aids

## 2021-11-12 NOTE — Progress Notes (Signed)
Subjective:  Patient ID: Joshua Adkins, male    DOB: 01/28/1936  Age: 86 y.o. MRN: [id number]  CC: No chief complaint on file.   HPI Joshua Adkins presents for HTN, dyslipidemia, memory loss; hearing loss Wife died in 09/01/22.  Outpatient Medications Prior to Visit  Medication Sig Dispense Refill   Ascorbic Acid (VITAMIN C) 1000 MG tablet Take 1,000 mg by mouth every other day. Reported on 09/05/2015     aspirin EC 81 MG tablet Take 81 mg by mouth daily.     Cholecalciferol 1000 UNITS tablet Take 1,000 Units by mouth every other day.     atorvastatin (LIPITOR) 10 MG tablet Take 1 tablet by mouth once daily 90 tablet 1   donepezil (ARICEPT) 5 MG tablet Take 1 tablet (5 mg total) by mouth at bedtime. 90 tablet 3   olmesartan-hydrochlorothiazide (BENICAR HCT) 40-25 MG tablet Take 1 tablet by mouth once daily 90 tablet 1   PARoxetine (PAXIL) 10 MG tablet Take 1 tablet by mouth once daily 30 tablet 3   terazosin (HYTRIN) 2 MG capsule TAKE 1 CAPSULE BY MOUTH EVERY DAY AT BEDTIME 90 capsule 3   No facility-administered medications prior to visit.    ROS: Review of Systems  Constitutional:  Negative for appetite change, fatigue and unexpected weight change.  HENT:  Positive for hearing loss. Negative for congestion, nosebleeds, sneezing, sore throat and trouble swallowing.   Eyes:  Negative for itching and visual disturbance.  Respiratory:  Negative for cough.   Cardiovascular:  Negative for chest pain, palpitations and leg swelling.  Gastrointestinal:  Negative for abdominal distention, blood in stool, diarrhea and nausea.  Genitourinary:  Negative for frequency and hematuria.  Musculoskeletal:  Negative for back pain, gait problem, joint swelling and neck pain.  Skin:  Negative for rash.  Neurological:  Negative for dizziness, tremors, speech difficulty and weakness.  Psychiatric/Behavioral:  Positive for dysphoric mood. Negative for agitation, sleep disturbance and suicidal ideas.  The patient is not nervous/anxious.    Objective:  BP (!) 150/78 (BP Location: Left Arm, Patient Position: Sitting)   Pulse (!) 112   Temp 98.2 F (36.8 C) (Oral)   Ht '5\' 9"'$  (1.753 m)   Wt 144 lb (65.3 kg)   SpO2 97%   BMI 21.27 kg/m   BP Readings from Last 3 Encounters:  11/12/21 (!) 150/78  08/07/21 (P) 132/70  03/13/21 (!) 132/58    Wt Readings from Last 3 Encounters:  11/12/21 144 lb (65.3 kg)  08/07/21 (P) 142 lb (64.4 kg)  03/13/21 133 lb 3.2 oz (60.4 kg)    Physical Exam Constitutional:      General: He is not in acute distress.    Appearance: He is well-developed. He is obese.     Comments: NAD  Eyes:     Conjunctiva/sclera: Conjunctivae normal.     Pupils: Pupils are equal, round, and reactive to light.  Neck:     Thyroid: No thyromegaly.     Vascular: No JVD.  Cardiovascular:     Rate and Rhythm: Normal rate and regular rhythm.     Heart sounds: Normal heart sounds. No murmur heard.   No friction rub. No gallop.  Pulmonary:     Effort: Pulmonary effort is normal. No respiratory distress.     Breath sounds: Normal breath sounds. No wheezing or rales.  Chest:     Chest wall: No tenderness.  Abdominal:     General: Bowel sounds are normal. There  is no distension.     Palpations: Abdomen is soft. There is no mass.     Tenderness: There is no abdominal tenderness. There is no guarding or rebound.  Musculoskeletal:        General: No tenderness. Normal range of motion.     Cervical back: Normal range of motion.  Lymphadenopathy:     Cervical: No cervical adenopathy.  Skin:    General: Skin is warm and dry.     Findings: No rash.  Neurological:     Mental Status: He is alert and oriented to person, place, and time.     Cranial Nerves: No cranial nerve deficit.     Motor: No abnormal muscle tone.     Coordination: Coordination normal.     Gait: Gait normal.     Deep Tendon Reflexes: Reflexes are normal and symmetric.  Psychiatric:        Behavior:  Behavior normal.        Thought Content: Thought content normal.        Judgment: Judgment normal.  Sad Hard hearing  Lab Results  Component Value Date   WBC 3.9 (L) 08/16/2020   HGB 11.8 (L) 08/16/2020   HCT 34.6 (L) 08/16/2020   PLT 102.0 (L) 08/16/2020   GLUCOSE 97 08/16/2020   CHOL 133 08/16/2020   TRIG 58.0 08/16/2020   HDL 63.70 08/16/2020   LDLCALC 58 08/16/2020   ALT 3 08/16/2020   AST 24 08/16/2020   NA 141 08/16/2020   K 4.2 08/16/2020   CL 104 08/16/2020   CREATININE 1.12 08/16/2020   BUN 39 (H) 08/16/2020   CO2 31 08/16/2020   TSH 1.66 08/16/2020   PSA 1.64 08/16/2020    DG Wrist Complete Left  Result Date: 07/24/2019 CLINICAL DATA:  [place] [address] onto outstretched hand today. Left wrist pain. Initial encounter. EXAM: LEFT WRIST - COMPLETE 3+ VIEW COMPARISON:  None. FINDINGS: No evidence of fracture or dislocation. Severe osteoarthritis is seen involving the base of the thumb. Moderate osteoarthritis is also seen involving the radiocarpal joint space. Widening of scapholunate joint is consistent with ligamentous injury. Generalized osteopenia also noted. IMPRESSION: 1. No acute findings. 2. Advanced osteoarthritis of base of thumb and radiocarpal joint space. Electronically Signed   By: Marlaine Hind M.D.   On: 07/24/2019 17:24    Assessment & Plan:   Problem List Items Addressed This Visit     Hearing loss    Worse Go to [facility] to get hearing aids       Grief    Wife died in 2021-08-20 Discussed      Mild cognitive disorder    Worse Get hearing aids Cont w/Aricept          Meds ordered this encounter  Medications   atorvastatin (LIPITOR) 10 MG tablet    Sig: Take 1 tablet (10 mg total) by mouth daily. Take with dinner    Dispense:  90 tablet    Refill:  3   donepezil (ARICEPT) 5 MG tablet    Sig: Take 1 tablet (5 mg total) by mouth at bedtime.    Dispense:  90 tablet    Refill:  3   olmesartan-hydrochlorothiazide (BENICAR HCT) 40-25 MG tablet     Sig: Take 1 tablet by mouth daily. Take in AM    Dispense:  90 tablet    Refill:  3   PARoxetine (PAXIL) 10 MG tablet    Sig: Take 1 tablet (10 mg total) by mouth  daily. Take in am.    Dispense:  90 tablet    Refill:  1   terazosin (HYTRIN) 2 MG capsule    Sig: TAKE 1 CAPSULE BY MOUTH EVERY DAY AT BEDTIME    Dispense:  90 capsule    Refill:  3      Follow-up: Return in about 3 months (around 02/12/2022) for a follow-up visit.  Walker Kehr, MD

## 2021-11-12 NOTE — Assessment & Plan Note (Signed)
Wife died in 07/2021 Discussed

## 2022-02-11 ENCOUNTER — Ambulatory Visit (INDEPENDENT_AMBULATORY_CARE_PROVIDER_SITE_OTHER): Payer: Medicare HMO

## 2022-02-11 DIAGNOSIS — Z0000 Encounter for general adult medical examination without abnormal findings: Secondary | ICD-10-CM | POA: Diagnosis not present

## 2022-02-11 NOTE — Progress Notes (Cosign Needed Addendum)
Subjective:   Joshua Adkins is a 86 y.o. male who presents for an Subsequent Medicare Annual Wellness Visit.   I connected with Joshua Adkins  today by telephone and verified that I am speaking with the correct person using two identifiers. Location patient: home Location provider: work Persons participating in the virtual visit: patient, provider.   I discussed the limitations, risks, security and privacy concerns of performing an evaluation and management service by telephone and the availability of in person appointments. I also discussed with the patient that there may be a patient responsible charge related to this service. The patient expressed understanding and verbally consented to this telephonic visit.    Interactive audio and video telecommunications were attempted between this provider and patient, however failed, due to patient having technical difficulties OR patient did not have access to video capability.  We continued and completed visit with audio only.    Review of Systems     Cardiac Risk Factors include: advanced age (>83mn, >>82women);male gender     Objective:    Today's Vitals   There is no height or weight on file to calculate BMI.     02/11/2022    3:54 PM  Advanced Directives  Does Patient Have a Medical Advance Directive? Yes  Type of [place]  Copy of [place]? No - copy requested    Current Medications (verified) Outpatient Encounter Medications as of 02/11/2022  Medication Sig   Ascorbic Acid (VITAMIN C) 1000 MG tablet Take 1,000 mg by mouth every other day. Reported on 09/05/2015   aspirin EC 81 MG tablet Take 81 mg by mouth daily.   atorvastatin (LIPITOR) 10 MG tablet Take 1 tablet (10 mg total) by mouth daily. Take with dinner   Cholecalciferol 1000 UNITS tablet Take 1,000 Units by mouth every other day.   donepezil (ARICEPT) 5 MG tablet Take 1 tablet (5 mg  total) by mouth at bedtime.   olmesartan-hydrochlorothiazide (BENICAR HCT) 40-25 MG tablet Take 1 tablet by mouth daily. Take in AM   PARoxetine (PAXIL) 10 MG tablet Take 1 tablet (10 mg total) by mouth daily. Take in am.   terazosin (HYTRIN) 2 MG capsule TAKE 1 CAPSULE BY MOUTH EVERY DAY AT BEDTIME   No facility-administered encounter medications on file as of 02/11/2022.    Allergies (verified) Amlodipine, Cardura [doxazosin], and Carvedilol   History: Past Medical History:  Diagnosis Date   Hyperlipidemia    Hypertension    Past Surgical History:  Procedure Laterality Date   APPENDECTOMY     Family History  Problem Relation Age of Onset   Cancer Mother        Breast   Heart disease Father    Cancer Sister        Colon    Cancer Daughter        Brain   Social History   Socioeconomic History   Marital status: Widowed    Spouse name: Not on file   Number of children: Not on file   Years of education: Not on file   Highest education level: Not on file  Occupational History   Not on file  Tobacco Use   Smoking status: Never   Smokeless tobacco: Never  Vaping Use   Vaping Use: Never used  Substance and Sexual Activity   Alcohol use: No   Drug use: No   Sexual activity: Not on file  Other Topics  Concern   Not on file  Social History Narrative   Not on file   Social Determinants of Health   Financial Resource Strain: Low Risk  (02/11/2022)   Overall Financial Resource Strain (CARDIA)    Difficulty of Paying Living Expenses: Not hard at all  Food Insecurity: No Food Insecurity (02/11/2022)   Hunger Vital Sign    Worried About Running Out of Food in the Last Year: Never true    Ran Out of Food in the Last Year: Never true  Transportation Needs: No Transportation Needs (02/11/2022)   PRAPARE - Hydrologist (Medical): No    Lack of Transportation (Non-Medical): No  Physical Activity: Insufficiently Active (02/11/2022)   Exercise Vital  Sign    Days of Exercise per Week: 5 days    Minutes of Exercise per Session: 10 min  Stress: No Stress Concern Present (02/11/2022)   [place]    Feeling of Stress : Not at all  Social Connections: Socially Isolated (02/11/2022)   Social Connection and Isolation Panel [NHANES]    Frequency of Communication with Friends and Family: Three times a week    Frequency of Social Gatherings with Friends and Family: Three times a week    Attends Religious Services: Never    Active Member of Clubs or Organizations: No    Attends Archivist Meetings: Never    Marital Status: Widowed    Tobacco Counseling Counseling given: Not Answered   Clinical Intake:  Pre-visit preparation completed: Yes  Pain : No/denies pain     Nutritional Risks: None Diabetes: No  How often do you need to have someone help you when you read instructions, pamphlets, or other written materials from your doctor or pharmacy?: 1 - Never What is the last grade level you completed in school?: 10th grade  Diabetic?no   Interpreter Needed?: No  Information entered by :: L.Wilson,LPN   Activities of Daily Living    02/11/2022    3:57 PM  In your present state of health, do you have any difficulty performing the following activities:  Hearing? 1  Vision? 0  Difficulty concentrating or making decisions? 0  Walking or climbing stairs? 0  Dressing or bathing? 0  Doing errands, shopping? 0  Preparing Food and eating ? N  Using the Toilet? N  In the past six months, have you accidently leaked urine? N  Do you have problems with loss of bowel control? N  Managing your Medications? N  Managing your Finances? N  Housekeeping or managing your Housekeeping? N    Patient Care Team: Plotnikov, Evie Lacks, MD as PCP - General (Internal Medicine) Arta Silence, MD as Consulting Physician (Gastroenterology)  Indicate any recent Medical  Services you may have received from other than [facility] providers in the past year (date may be approximate).     Assessment:   This is a routine wellness examination for Joshua Adkins.  Hearing/Vision screen Vision Screening - Comments:: Annual eye exams wears glasses   Dietary issues and exercise activities discussed: Current Exercise Habits: Home exercise routine, Type of exercise: walking, Time (Minutes): 10, Frequency (Times/Week): 5, Weekly Exercise (Minutes/Week): 50, Intensity: Mild, Exercise limited by: None identified   Goals Addressed   None    Depression Screen    02/11/2022    3:55 PM 02/11/2022    3:52 PM 11/12/2021    3:25 PM 08/07/2021    4:15 PM 08/16/2020  11:36 AM 07/08/2019   11:35 AM 05/07/2018   10:27 AM  PHQ 2/9 Scores  PHQ - 2 Score 0 0 0 0 0 0 0  PHQ- 9 Score   0 0 0      Fall Risk    02/11/2022    3:54 PM 08/07/2021    4:15 PM 02/03/2020    2:47 PM 05/07/2018   10:26 AM 04/25/2017    8:21 AM  [place] in the past year? 0 0 1 1 Yes  Number falls in past yr: 0 0 0 1 2 or more  Injury with Fall? 0 0 0 0 No  Risk for fall due to :  No Fall Risks Impaired balance/gait;Impaired mobility    Follow up Falls evaluation completed;Education provided Falls evaluation completed Falls evaluation completed Falls evaluation completed     FALL RISK PREVENTION PERTAINING TO THE HOME:  Any stairs in or around the home? Yes  If so, are there any without handrails? No  Home free of loose throw rugs in walkways, pet beds, electrical cords, etc? Yes  Adequate lighting in your home to reduce risk of falls? Yes   ASSISTIVE DEVICES UTILIZED TO PREVENT FALLS:  Life alert? No  Use of a cane, walker or w/c? No  Grab bars in the bathroom? No  Shower chair or bench in shower? No  Elevated toilet seat or a handicapped toilet? No     Cognitive Function:  Normal cognitive status assessed by direct observation by this Nurse Health Advisor. No abnormalities found.         02/11/2022    3:59 PM  6CIT Screen  What Year? 0 points  What month? 0 points  What time? 3 points  Count back from 20 0 points  Months in reverse 0 points  Repeat phrase 0 points  Total Score 3 points    Immunizations Immunization History  Administered Date(s) Administered   Pneumococcal Conjugate-13 02/16/2014   Pneumococcal Polysaccharide-23 02/17/2012   Tdap 08/16/2011, 07/24/2019    TDAP status: Up to date  Flu Vaccine status: Up to date  Pneumococcal vaccine status: Up to date  Covid-19 vaccine status: Completed vaccines  Qualifies for Shingles Vaccine? Yes   Zostavax completed No   Shingrix Completed?: No.    Education has been provided regarding the importance of this vaccine. Patient has been advised to call insurance company to determine out of pocket expense if they have not yet received this vaccine. Advised may also receive vaccine at local pharmacy or Health Dept. Verbalized acceptance and understanding.  Screening Tests Health Maintenance  Topic Date Due   Zoster Vaccines- Shingrix (1 of 2) Never done   TETANUS/TDAP  07/23/2029   Pneumonia Vaccine 46+ Years old  Completed   HPV VACCINES  Aged Out   INFLUENZA VACCINE  Discontinued   COVID-19 Vaccine  Discontinued    Health Maintenance  Health Maintenance Due  Topic Date Due   Zoster Vaccines- Shingrix (1 of 2) Never done    Colorectal cancer screening: No longer required.   Lung Cancer Screening: (Low Dose CT Chest recommended if Age 77-80 years, 30 pack-year currently smoking OR have quit w/in 15years.) does not qualify.   Lung Cancer Screening Referral: n/a  Additional Screening:  Hepatitis C Screening: does not qualify;   Vision Screening: Recommended annual ophthalmology exams for early detection of glaucoma and other disorders of the eye. Is the patient up to date with their annual eye exam?  Yes  Who is the provider or what is the name of the office in which the patient attends annual  eye exams? Dr.Shipiro  If pt is not established with a provider, would they like to be referred to a provider to establish care? No .   Dental Screening: Recommended annual dental exams for proper oral hygiene  Community Resource Referral / Chronic Care Management: CRR required this visit?  No   CCM required this visit?  No      Plan:     I have personally reviewed and noted the following in the patient's chart:   Medical and social history Use of alcohol, tobacco or illicit drugs  Current medications and supplements including opioid prescriptions. Patient is not currently taking opioid prescriptions. Functional ability and status Nutritional status Physical activity Advanced directives List of other physicians Hospitalizations, surgeries, and ER visits in previous 12 months Vitals Screenings to include cognitive, depression, and falls Referrals and appointments  In addition, I have reviewed and discussed with patient certain preventive protocols, quality metrics, and best practice recommendations. A written personalized care plan for preventive services as well as general preventive health recommendations were provided to patient.     Daphane Shepherd, LPN   [id number]   Nurse Notes: none    Medical screening examination/treatment/procedure(s) were performed by non-physician practitioner and as supervising physician I was immediately available for consultation/collaboration.  I agree with above. Lew Dawes, MD

## 2022-02-11 NOTE — Patient Instructions (Signed)
Mr. Joshua Adkins , Thank you for taking time to come for your Medicare Wellness Visit. I appreciate your ongoing commitment to your health goals. Please review the following plan we discussed and let me know if I can assist you in the future.   Screening recommendations/referrals: Colonoscopy: no longer required  Recommended yearly ophthalmology/optometry visit for glaucoma screening and checkup Recommended yearly dental visit for hygiene and checkup  Vaccinations: Influenza vaccine: completed  Pneumococcal vaccine: completed  Tdap vaccine: 07/24/2019 Shingles vaccine: will consider     Advanced directives: yes   Conditions/risks identified: none   Next appointment: none   Preventive Care 4 Years and Older, Male Preventive care refers to lifestyle choices and visits with your health care provider that can promote health and wellness. What does preventive care include? A yearly physical exam. This is also called an annual well check. Dental exams once or twice a year. Routine eye exams. Ask your health care provider how often you should have your eyes checked. Personal lifestyle choices, including: Daily care of your teeth and gums. Regular physical activity. Eating a healthy diet. Avoiding tobacco and drug use. Limiting alcohol use. Practicing safe sex. Taking low doses of aspirin every day. Taking vitamin and mineral supplements as recommended by your health care provider. What happens during an annual well check? The services and screenings done by your health care provider during your annual well check will depend on your age, overall health, lifestyle risk factors, and family history of disease. Counseling  Your health care provider may ask you questions about your: Alcohol use. Tobacco use. Drug use. Emotional well-being. Home and relationship well-being. Sexual activity. Eating habits. History of falls. Memory and ability to understand (cognition). Work and work  Statistician. Screening  You may have the following tests or measurements: Height, weight, and BMI. Blood pressure. Lipid and cholesterol levels. These may be checked every 5 years, or more frequently if you are over 25 years old. Skin check. Lung cancer screening. You may have this screening every year starting at age 37 if you have a 30-pack-year history of smoking and currently smoke or have quit within the past 15 years. Fecal occult blood test (FOBT) of the stool. You may have this test every year starting at age 65. Flexible sigmoidoscopy or colonoscopy. You may have a sigmoidoscopy every 5 years or a colonoscopy every 10 years starting at age 55. Prostate cancer screening. Recommendations will vary depending on your family history and other risks. Hepatitis C blood test. Hepatitis B blood test. Sexually transmitted disease (STD) testing. Diabetes screening. This is done by checking your blood sugar (glucose) after you have not eaten for a while (fasting). You may have this done every 1-3 years. Abdominal aortic aneurysm (AAA) screening. You may need this if you are a current or former smoker. Osteoporosis. You may be screened starting at age 19 if you are at high risk. Talk with your health care provider about your test results, treatment options, and if necessary, the need for more tests. Vaccines  Your health care provider may recommend certain vaccines, such as: Influenza vaccine. This is recommended every year. Tetanus, diphtheria, and acellular pertussis (Tdap, Td) vaccine. You may need a Td booster every 10 years. Zoster vaccine. You may need this after age 86. Pneumococcal 13-valent conjugate (PCV13) vaccine. One dose is recommended after age 87. Pneumococcal polysaccharide (PPSV23) vaccine. One dose is recommended after age 42. Talk to your health care provider about which screenings and vaccines you need and  how often you need them. This information is not intended to replace  advice given to you by your health care provider. Make sure you discuss any questions you have with your health care provider. Document Released: 07/07/2015 Document Revised: 02/28/2016 Document Reviewed: 04/11/2015 Elsevier Interactive Patient Education  2017 [place] Prevention in the Home Falls can cause injuries. They can happen to people of all ages. There are many things you can do to make your home safe and to help prevent falls. What can I do on the outside of my home? Regularly fix the edges of walkways and driveways and fix any cracks. Remove anything that might make you trip as you walk through a door, such as a raised step or threshold. Trim any bushes or trees on the path to your home. Use bright outdoor lighting. Clear any walking paths of anything that might make someone trip, such as rocks or tools. Regularly check to see if handrails are loose or broken. Make sure that both sides of any steps have handrails. Any raised decks and porches should have guardrails on the edges. Have any leaves, snow, or ice cleared regularly. Use sand or salt on walking paths during winter. Clean up any spills in your garage right away. This includes oil or grease spills. What can I do in the bathroom? Use night lights. Install grab bars by the toilet and in the tub and shower. Do not use towel bars as grab bars. Use non-skid mats or decals in the tub or shower. If you need to sit down in the shower, use a plastic, non-slip stool. Keep the floor dry. Clean up any water that spills on the floor as soon as it happens. Remove soap buildup in the tub or shower regularly. Attach bath mats securely with double-sided non-slip rug tape. Do not have throw rugs and other things on the floor that can make you trip. What can I do in the bedroom? Use night lights. Make sure that you have a light by your bed that is easy to reach. Do not use any sheets or blankets that are too big for your bed.  They should not hang down onto the floor. Have a firm chair that has side arms. You can use this for support while you get dressed. Do not have throw rugs and other things on the floor that can make you trip. What can I do in the kitchen? Clean up any spills right away. Avoid walking on wet floors. Keep items that you use a lot in easy-to-reach places. If you need to reach something above you, use a strong step stool that has a grab bar. Keep electrical cords out of the way. Do not use floor polish or wax that makes floors slippery. If you must use wax, use non-skid floor wax. Do not have throw rugs and other things on the floor that can make you trip. What can I do with my stairs? Do not leave any items on the stairs. Make sure that there are handrails on both sides of the stairs and use them. Fix handrails that are broken or loose. Make sure that handrails are as long as the stairways. Check any carpeting to make sure that it is firmly attached to the stairs. Fix any carpet that is loose or worn. Avoid having throw rugs at the top or bottom of the stairs. If you do have throw rugs, attach them to the floor with carpet tape. Make sure that you have  a light switch at the top of the stairs and the bottom of the stairs. If you do not have them, ask someone to add them for you. What else can I do to help prevent falls? Wear shoes that: Do not have high heels. Have rubber bottoms. Are comfortable and fit you well. Are closed at the toe. Do not wear sandals. If you use a stepladder: Make sure that it is fully opened. Do not climb a closed stepladder. Make sure that both sides of the stepladder are locked into place. Ask someone to hold it for you, if possible. Clearly mark and make sure that you can see: Any grab bars or handrails. First and last steps. Where the edge of each step is. Use tools that help you move around (mobility aids) if they are needed. These  include: Canes. Walkers. Scooters. Crutches. Turn on the lights when you go into a dark area. Replace any light bulbs as soon as they burn out. Set up your furniture so you have a clear path. Avoid moving your furniture around. If any of your floors are uneven, fix them. If there are any pets around you, be aware of where they are. Review your medicines with your doctor. Some medicines can make you feel dizzy. This can increase your chance of falling. Ask your doctor what other things that you can do to help prevent falls. This information is not intended to replace advice given to you by your health care provider. Make sure you discuss any questions you have with your health care provider. Document Released: 04/06/2009 Document Revised: 11/16/2015 Document Reviewed: 07/15/2014 Elsevier Interactive Patient Education  2017 [employer].

## 2022-02-12 ENCOUNTER — Ambulatory Visit (INDEPENDENT_AMBULATORY_CARE_PROVIDER_SITE_OTHER): Payer: Medicare HMO | Admitting: Internal Medicine

## 2022-02-12 ENCOUNTER — Encounter: Payer: Self-pay | Admitting: Internal Medicine

## 2022-02-12 DIAGNOSIS — R140 Abdominal distension (gaseous): Secondary | ICD-10-CM | POA: Diagnosis not present

## 2022-02-12 DIAGNOSIS — I10 Essential (primary) hypertension: Secondary | ICD-10-CM

## 2022-02-12 DIAGNOSIS — H906 Mixed conductive and sensorineural hearing loss, bilateral: Secondary | ICD-10-CM

## 2022-02-12 DIAGNOSIS — F09 Unspecified mental disorder due to known physiological condition: Secondary | ICD-10-CM | POA: Diagnosis not present

## 2022-02-12 DIAGNOSIS — R69 Illness, unspecified: Secondary | ICD-10-CM | POA: Diagnosis not present

## 2022-02-12 MED ORDER — METRONIDAZOLE 500 MG PO TABS: 500.0000 mg | ORAL_TABLET | Freq: Three times a day (TID) | ORAL | 0 refills | Status: DC

## 2022-02-12 NOTE — Progress Notes (Addendum)
Subjective:  Patient ID: Joshua Adkins, male    DOB: [birth year]  Age: 86 y.o. MRN: [id number]  CC: Follow-up (3 month f/u)   HPI [name] presents for bloating, pt lost wt but has a good appetite, busy all day. C/o occ loose stool. C/o poor memory. Here w/[name]   Outpatient Medications Prior to Visit  Medication Sig Dispense Refill   Ascorbic Acid (VITAMIN C) 1000 MG tablet Take 1,000 mg by mouth every other day. Reported on 09/05/2015     aspirin EC 81 MG tablet Take 81 mg by mouth daily.     atorvastatin (LIPITOR) 10 MG tablet Take 1 tablet (10 mg total) by mouth daily. Take with dinner 90 tablet 3   Cholecalciferol 1000 UNITS tablet Take 1,000 Units by mouth every other day.     donepezil (ARICEPT) 5 MG tablet Take 1 tablet (5 mg total) by mouth at bedtime. 90 tablet 3   olmesartan-hydrochlorothiazide (BENICAR HCT) 40-25 MG tablet Take 1 tablet by mouth daily. Take in AM 90 tablet 3   PARoxetine (PAXIL) 10 MG tablet Take 1 tablet (10 mg total) by mouth daily. Take in am. 90 tablet 1   terazosin (HYTRIN) 2 MG capsule TAKE 1 CAPSULE BY MOUTH EVERY DAY AT BEDTIME 90 capsule 3   No facility-administered medications prior to visit.    ROS: Review of Systems  Constitutional:  Negative for appetite change, fatigue and unexpected weight change.  HENT:  Negative for congestion, nosebleeds, sneezing, sore throat and trouble swallowing.   Eyes:  Negative for itching and visual disturbance.  Respiratory:  Negative for cough.   Cardiovascular:  Negative for chest pain, palpitations and leg swelling.  Gastrointestinal:  Positive for abdominal distention. Negative for blood in stool, diarrhea and nausea.  Genitourinary:  Positive for frequency and urgency. Negative for hematuria.  Musculoskeletal:  Positive for gait problem. Negative for back pain, joint swelling, neck pain and neck stiffness.  Skin:  Negative for rash.  Neurological:  Negative for dizziness, tremors, speech  difficulty and weakness.  Hematological:  Does not bruise/bleed easily.  Psychiatric/Behavioral:  Positive for confusion and decreased concentration. Negative for agitation, dysphoric mood, sleep disturbance and suicidal ideas. The patient is not nervous/anxious.     Objective:  BP (!) 148/72 (BP Location: Left Arm)   Pulse (!) 56   Temp 97.8 F (36.6 C) (Oral)   Ht '5\' 9"'$  (1.753 m)   Wt 137 lb 3.2 oz (62.2 kg)   SpO2 96%   BMI 20.26 kg/m   BP Readings from Last 3 Encounters:  02/12/22 (!) 148/72  11/12/21 (!) 150/78  08/07/21 (P) 132/70    Wt Readings from Last 3 Encounters:  02/12/22 137 lb 3.2 oz (62.2 kg)  11/12/21 144 lb (65.3 kg)  08/07/21 (P) 142 lb (64.4 kg)    Physical Exam Constitutional:      General: He is not in acute distress.    Appearance: Normal appearance. He is well-developed.     Comments: NAD  Eyes:     Conjunctiva/sclera: Conjunctivae normal.     Pupils: Pupils are equal, round, and reactive to light.  Neck:     Thyroid: No thyromegaly.     Vascular: No JVD.  Cardiovascular:     Rate and Rhythm: Normal rate and regular rhythm.     Heart sounds: Normal heart sounds. No murmur heard.    No friction rub. No gallop.  Pulmonary:     Effort: Pulmonary effort is  normal. No respiratory distress.     Breath sounds: Normal breath sounds. No wheezing or rales.  Chest:     Chest wall: No tenderness.  Abdominal:     General: Bowel sounds are normal. There is no distension.     Palpations: Abdomen is soft. There is no mass.     Tenderness: There is no abdominal tenderness. There is no guarding or rebound.  Musculoskeletal:        General: No tenderness. Normal range of motion.     Cervical back: Normal range of motion.  Lymphadenopathy:     Cervical: No cervical adenopathy.  Skin:    General: Skin is warm and dry.     Findings: No rash.  Neurological:     Mental Status: He is alert and oriented to person, place, and time.     Cranial Nerves: No  cranial nerve deficit.     Motor: No abnormal muscle tone.     Coordination: Coordination normal.     Gait: Gait normal.     Deep Tendon Reflexes: Reflexes are normal and symmetric.  Psychiatric:        Behavior: Behavior normal.        Thought Content: Thought content normal.        Judgment: Judgment normal.   Stooping posture  Lab Results  Component Value Date   WBC 4.5 11/12/2021   HGB 11.0 (L) 11/12/2021   HCT 32.6 (L) 11/12/2021   PLT 106.0 (L) 11/12/2021   GLUCOSE 99 11/12/2021   CHOL 133 08/16/2020   TRIG 58.0 08/16/2020   HDL 63.70 08/16/2020   LDLCALC 58 08/16/2020   ALT 3 11/12/2021   AST 22 11/12/2021   NA 141 11/12/2021   K 4.5 11/12/2021   CL 107 11/12/2021   CREATININE 1.16 11/12/2021   BUN 36 (H) 11/12/2021   CO2 29 11/12/2021   TSH 2.25 11/12/2021   PSA 1.64 08/16/2020    DG Wrist Complete Left  Result Date: 07/24/2019 CLINICAL DATA:  [place] onto outstretched hand today. Left wrist pain. Initial encounter. EXAM: LEFT WRIST - COMPLETE 3+ VIEW COMPARISON:  None. FINDINGS: No evidence of fracture or dislocation. Severe osteoarthritis is seen involving the base of the thumb. Moderate osteoarthritis is also seen involving the radiocarpal joint space. Widening of scapholunate joint is consistent with ligamentous injury. Generalized osteopenia also noted. IMPRESSION: 1. No acute findings. 2. Advanced osteoarthritis of base of thumb and radiocarpal joint space. Electronically Signed   By: Marlaine Hind M.D.   On: 07/24/2019 17:24    Assessment & Plan:   Problem List Items Addressed This Visit     Essential hypertension    Cont on Benicar and Hytrin      Hearing loss    Go to [employer] to get hearing aids      Meteorism    Flagyl followed by a probiotic      Mild cognitive disorder    Cont w/Aricept         Meds ordered this encounter  Medications   metroNIDAZOLE (FLAGYL) 500 MG tablet    Sig: Take 1 tablet (500 mg total) by mouth 3 (three) times  daily.    Dispense:  21 tablet    Refill:  0      Follow-up: Return in about 4 months (around 06/14/2022) for a follow-up visit.  Walker Kehr, MD

## 2022-02-12 NOTE — Assessment & Plan Note (Signed)
Cont w/Aricept

## 2022-02-12 NOTE — Assessment & Plan Note (Signed)
Go to [employer] to get hearing aids

## 2022-02-12 NOTE — Assessment & Plan Note (Signed)
Flagyl followed by a probiotic

## 2022-02-12 NOTE — Assessment & Plan Note (Signed)
Cont on Benicar and Hytrin

## 2022-02-27 DIAGNOSIS — L570 Actinic keratosis: Secondary | ICD-10-CM | POA: Diagnosis not present

## 2022-02-27 DIAGNOSIS — D485 Neoplasm of uncertain behavior of skin: Secondary | ICD-10-CM | POA: Diagnosis not present

## 2022-02-27 DIAGNOSIS — C44719 Basal cell carcinoma of skin of left lower limb, including hip: Secondary | ICD-10-CM | POA: Diagnosis not present

## 2022-06-12 ENCOUNTER — Encounter: Payer: Self-pay | Admitting: Internal Medicine

## 2022-06-12 ENCOUNTER — Ambulatory Visit (INDEPENDENT_AMBULATORY_CARE_PROVIDER_SITE_OTHER): Payer: Medicare HMO | Admitting: Internal Medicine

## 2022-06-12 VITALS — BP 122/62 | HR 68 | Temp 98.2°F | Ht 69.0 in | Wt 143.0 lb

## 2022-06-12 DIAGNOSIS — R413 Other amnesia: Secondary | ICD-10-CM | POA: Diagnosis not present

## 2022-06-12 DIAGNOSIS — R634 Abnormal weight loss: Secondary | ICD-10-CM | POA: Diagnosis not present

## 2022-06-12 DIAGNOSIS — S0990XA Unspecified injury of head, initial encounter: Secondary | ICD-10-CM | POA: Diagnosis not present

## 2022-06-12 DIAGNOSIS — R269 Unspecified abnormalities of gait and mobility: Secondary | ICD-10-CM | POA: Insufficient documentation

## 2022-06-12 LAB — COMPREHENSIVE METABOLIC PANEL
ALT: 5 U/L (ref 0–53)
AST: 33 U/L (ref 0–37)
Albumin: 4.3 g/dL (ref 3.5–5.2)
Alkaline Phosphatase: 53 U/L (ref 39–117)
BUN: 43 mg/dL — ABNORMAL HIGH (ref 6–23)
CO2: 33 mEq/L — ABNORMAL HIGH (ref 19–32)
Calcium: 9.7 mg/dL (ref 8.4–10.5)
Chloride: 100 mEq/L (ref 96–112)
Creatinine, Ser: 1.17 mg/dL (ref 0.40–1.50)
GFR: 57.22 mL/min — ABNORMAL LOW (ref >60.00)
Glucose, Bld: 111 mg/dL — ABNORMAL HIGH (ref 70–99)
Potassium: 3.9 mEq/L (ref 3.5–5.1)
Sodium: 142 mEq/L (ref 135–145)
Total Bilirubin: 0.5 mg/dL (ref 0.2–1.2)
Total Protein: 7.1 g/dL (ref 6.0–8.3)

## 2022-06-12 LAB — URINALYSIS
Bilirubin Urine: NEGATIVE
Hgb urine dipstick: NEGATIVE
Ketones, ur: NEGATIVE
Leukocytes,Ua: NEGATIVE
Nitrite: NEGATIVE
Specific Gravity, Urine: 1.015 (ref 1.000–1.030)
Urine Glucose: NEGATIVE
Urobilinogen, UA: 1 (ref 0.0–1.0)
pH: 7 (ref 5.0–8.0)

## 2022-06-12 LAB — CBC WITH DIFFERENTIAL/PLATELET
Basophils Absolute: 0 10*3/uL (ref 0.0–0.1)
Basophils Relative: 0.7 % (ref 0.0–3.0)
Eosinophils Absolute: 0 10*3/uL (ref 0.0–0.7)
Eosinophils Relative: 0 % (ref 0.0–5.0)
HCT: 35.4 % — ABNORMAL LOW (ref 39.0–52.0)
Hemoglobin: 12.1 g/dL — ABNORMAL LOW (ref 13.0–17.0)
Lymphocytes Relative: 38.6 % (ref 12.0–46.0)
Lymphs Abs: 1.7 10*3/uL (ref 0.7–4.0)
MCHC: 34.3 g/dL (ref 30.0–36.0)
MCV: 87.3 fl (ref 78.0–100.0)
Monocytes Absolute: 0.4 10*3/uL (ref 0.1–1.0)
Monocytes Relative: 8.6 % (ref 3.0–12.0)
Neutro Abs: 2.2 10*3/uL (ref 1.4–7.7)
Neutrophils Relative %: 52.1 % (ref 43.0–77.0)
Platelets: 120 10*3/uL — ABNORMAL LOW (ref 150.0–400.0)
RBC: 4.06 Mil/uL — ABNORMAL LOW (ref 4.22–5.81)
RDW: 14.8 % (ref 11.5–15.5)
WBC: 4.3 10*3/uL (ref 4.0–10.5)

## 2022-06-12 LAB — TSH: TSH: 2.11 u[IU]/mL (ref 0.35–5.50)

## 2022-06-12 NOTE — Assessment & Plan Note (Signed)
  Head CT Labs

## 2022-06-12 NOTE — Assessment & Plan Note (Signed)
Pt fell 1 week ago, unsteady Check head CT

## 2022-06-12 NOTE — Assessment & Plan Note (Signed)
Wt Readings from Last 3 Encounters:  06/12/22 143 lb (64.9 kg)  02/12/22 137 lb 3.2 oz (62.2 kg)  11/12/21 144 lb (65.3 kg)  Doing better

## 2022-06-12 NOTE — Assessment & Plan Note (Signed)
Pt fell 1 week ago, unsteady Head CT Labs

## 2022-06-12 NOTE — Progress Notes (Signed)
Subjective:  Patient ID: Joshua Adkins, male    DOB: 20-Jul-1935  Age: 86 y.o. MRN: [id number]  CC: Follow-up   HPI Joshua Adkins presents for MCD, unsteady gait, HTN Pt fell 1 week ago, unsteady  Outpatient Medications Prior to Visit  Medication Sig Dispense Refill   Ascorbic Acid (VITAMIN C) 1000 MG tablet Take 1,000 mg by mouth every other day. Reported on 09/05/2015     aspirin EC 81 MG tablet Take 81 mg by mouth daily.     atorvastatin (LIPITOR) 10 MG tablet Take 1 tablet (10 mg total) by mouth daily. Take with dinner 90 tablet 3   Cholecalciferol 1000 UNITS tablet Take 1,000 Units by mouth every other day.     donepezil (ARICEPT) 5 MG tablet Take 1 tablet (5 mg total) by mouth at bedtime. 90 tablet 3   olmesartan-hydrochlorothiazide (BENICAR HCT) 40-25 MG tablet Take 1 tablet by mouth daily. Take in AM 90 tablet 3   PARoxetine (PAXIL) 10 MG tablet Take 1 tablet (10 mg total) by mouth daily. Take in am. 90 tablet 1   terazosin (HYTRIN) 2 MG capsule TAKE 1 CAPSULE BY MOUTH EVERY DAY AT BEDTIME 90 capsule 3   metroNIDAZOLE (FLAGYL) 500 MG tablet Take 1 tablet (500 mg total) by mouth 3 (three) times daily. (Patient not taking: Reported on 06/12/2022) 21 tablet 0   No facility-administered medications prior to visit.    ROS: Review of Systems  Constitutional:  Negative for appetite change, fatigue and unexpected weight change.  HENT:  Negative for congestion, nosebleeds, sneezing, sore throat and trouble swallowing.   Eyes:  Negative for itching and visual disturbance.  Respiratory:  Negative for cough.   Cardiovascular:  Negative for chest pain, palpitations and leg swelling.  Gastrointestinal:  Negative for abdominal distention, blood in stool, diarrhea and nausea.  Genitourinary:  Negative for frequency and hematuria.  Musculoskeletal:  Positive for gait problem and neck stiffness. Negative for back pain, joint swelling and neck pain.  Skin:  Negative for rash.   Neurological:  Positive for dizziness and weakness. Negative for tremors and speech difficulty.  Psychiatric/Behavioral:  Negative for agitation, dysphoric mood and sleep disturbance. The patient is not nervous/anxious.     Objective:  BP 122/62 (BP Location: Right Arm, Patient Position: Sitting, Cuff Size: Normal)   Pulse 68   Temp 98.2 F (36.8 C) (Oral)   Ht '5\' 9"'$  (1.753 m)   Wt 143 lb (64.9 kg)   SpO2 98%   BMI 21.12 kg/m   BP Readings from Last 3 Encounters:  06/12/22 122/62  02/12/22 (!) 148/72  11/12/21 (!) 150/78    Wt Readings from Last 3 Encounters:  06/12/22 143 lb (64.9 kg)  02/12/22 137 lb 3.2 oz (62.2 kg)  11/12/21 144 lb (65.3 kg)    Physical Exam Constitutional:      General: He is not in acute distress.    Appearance: Normal appearance. He is well-developed.     Comments: NAD  Eyes:     Conjunctiva/sclera: Conjunctivae normal.     Pupils: Pupils are equal, round, and reactive to light.  Neck:     Thyroid: No thyromegaly.     Vascular: No JVD.  Cardiovascular:     Rate and Rhythm: Normal rate and regular rhythm.     Heart sounds: Normal heart sounds. No murmur heard.    No friction rub. No gallop.  Pulmonary:     Effort: Pulmonary effort is normal. No respiratory  distress.     Breath sounds: Normal breath sounds. No wheezing or rales.  Chest:     Chest wall: No tenderness.  Abdominal:     General: Bowel sounds are normal. There is no distension.     Palpations: Abdomen is soft. There is no mass.     Tenderness: There is no abdominal tenderness. There is no guarding or rebound.  Musculoskeletal:        General: No tenderness. Normal range of motion.     Cervical back: Normal range of motion.  Lymphadenopathy:     Cervical: No cervical adenopathy.  Skin:    General: Skin is warm and dry.     Findings: No rash.  Neurological:     Mental Status: He is alert and oriented to person, place, and time.     Cranial Nerves: No cranial nerve deficit.      Motor: No abnormal muscle tone.     Coordination: Coordination normal.     Gait: Gait normal.     Deep Tendon Reflexes: Reflexes are normal and symmetric.  Psychiatric:        Behavior: Behavior normal.        Thought Content: Thought content normal.        Judgment: Judgment normal.     Lab Results  Component Value Date   WBC 4.5 11/12/2021   HGB 11.0 (L) 11/12/2021   HCT 32.6 (L) 11/12/2021   PLT 106.0 (L) 11/12/2021   GLUCOSE 99 11/12/2021   CHOL 133 08/16/2020   TRIG 58.0 08/16/2020   HDL 63.70 08/16/2020   LDLCALC 58 08/16/2020   ALT 3 11/12/2021   AST 22 11/12/2021   NA 141 11/12/2021   K 4.5 11/12/2021   CL 107 11/12/2021   CREATININE 1.16 11/12/2021   BUN 36 (H) 11/12/2021   CO2 29 11/12/2021   TSH 2.25 11/12/2021   PSA 1.64 08/16/2020    DG Wrist Complete Left  Result Date: 07/24/2019 CLINICAL DATA:  [place] [address] onto outstretched hand today. Left wrist pain. Initial encounter. EXAM: LEFT WRIST - COMPLETE 3+ VIEW COMPARISON:  None. FINDINGS: No evidence of fracture or dislocation. Severe osteoarthritis is seen involving the base of the thumb. Moderate osteoarthritis is also seen involving the radiocarpal joint space. Widening of scapholunate joint is consistent with ligamentous injury. Generalized osteopenia also noted. IMPRESSION: 1. No acute findings. 2. Advanced osteoarthritis of base of thumb and radiocarpal joint space. Electronically Signed   By: Marlaine Hind M.D.   On: 07/24/2019 17:24    Assessment & Plan:   Problem List Items Addressed This Visit     Weight loss    Wt Readings from Last 3 Encounters:  06/12/22 143 lb (64.9 kg)  02/12/22 137 lb 3.2 oz (62.2 kg)  11/12/21 144 lb (65.3 kg)  Doing better       Relevant Orders   CBC with Differential/Platelet   TSH   Urinalysis   Memory change     Head CT Labs      Relevant Orders   CT HEAD WO CONTRAST (5MM)   CBC with Differential/Platelet   Comprehensive metabolic panel   TSH   Urinalysis    Head injury - Primary    Pt fell 1 week ago, unsteady Check head CT      Relevant Orders   CT HEAD WO CONTRAST (5MM)   CBC with Differential/Platelet   Comprehensive metabolic panel   TSH   Gait disorder    Pt fell 1  week ago, unsteady Head CT Labs      Relevant Orders   CT HEAD WO CONTRAST (5MM)   CBC with Differential/Platelet   Comprehensive metabolic panel   TSH      No orders of the defined types were placed in this encounter.     Follow-up: No follow-ups on file.  Walker Kehr, MD

## 2022-06-19 ENCOUNTER — Ambulatory Visit: Payer: Medicare HMO | Admitting: Internal Medicine

## 2022-07-09 ENCOUNTER — Ambulatory Visit
Admission: RE | Admit: 2022-07-09 | Discharge: 2022-07-09 | Disposition: A | Discharge status: Home / Self Care | Payer: Medicare HMO | Source: Ambulatory Visit | Attending: Internal Medicine | Admitting: Internal Medicine

## 2022-07-09 DIAGNOSIS — S0990XA Unspecified injury of head, initial encounter: Secondary | ICD-10-CM

## 2022-07-09 DIAGNOSIS — R413 Other amnesia: Secondary | ICD-10-CM

## 2022-07-09 DIAGNOSIS — R269 Unspecified abnormalities of gait and mobility: Secondary | ICD-10-CM

## 2022-07-09 DIAGNOSIS — R2681 Unsteadiness on feet: Secondary | ICD-10-CM | POA: Diagnosis not present

## 2022-10-09 ENCOUNTER — Emergency Department (HOSPITAL_COMMUNITY)
Admission: EM | Admit: 2022-10-09 | Discharge: 2022-10-09 | Disposition: A | Discharge status: Home / Self Care | Payer: Medicare HMO | Attending: Emergency Medicine | Admitting: Emergency Medicine

## 2022-10-09 ENCOUNTER — Emergency Department (HOSPITAL_COMMUNITY): Payer: Medicare HMO

## 2022-10-09 ENCOUNTER — Encounter: Payer: Self-pay | Admitting: Internal Medicine

## 2022-10-09 ENCOUNTER — Encounter (HOSPITAL_COMMUNITY): Payer: Self-pay

## 2022-10-09 ENCOUNTER — Ambulatory Visit (INDEPENDENT_AMBULATORY_CARE_PROVIDER_SITE_OTHER): Payer: Medicare HMO | Admitting: Internal Medicine

## 2022-10-09 VITALS — BP 118/56 | HR 74 | Temp 98.0°F | Ht 69.0 in | Wt 130.6 lb

## 2022-10-09 DIAGNOSIS — S52572A Other intraarticular fracture of lower end of left radius, initial encounter for closed fracture: Secondary | ICD-10-CM | POA: Diagnosis not present

## 2022-10-09 DIAGNOSIS — Z743 Need for continuous supervision: Secondary | ICD-10-CM | POA: Diagnosis not present

## 2022-10-09 DIAGNOSIS — I6789 Other cerebrovascular disease: Secondary | ICD-10-CM | POA: Insufficient documentation

## 2022-10-09 DIAGNOSIS — F0280 Dementia in other diseases classified elsewhere without behavioral disturbance: Secondary | ICD-10-CM

## 2022-10-09 DIAGNOSIS — R296 Repeated falls: Secondary | ICD-10-CM | POA: Diagnosis not present

## 2022-10-09 DIAGNOSIS — Y9301 Activity, walking, marching and hiking: Secondary | ICD-10-CM | POA: Insufficient documentation

## 2022-10-09 DIAGNOSIS — D649 Anemia, unspecified: Secondary | ICD-10-CM | POA: Diagnosis not present

## 2022-10-09 DIAGNOSIS — S0990XA Unspecified injury of head, initial encounter: Secondary | ICD-10-CM | POA: Diagnosis not present

## 2022-10-09 DIAGNOSIS — G309 Alzheimer's disease, unspecified: Secondary | ICD-10-CM

## 2022-10-09 DIAGNOSIS — S62102A Fracture of unspecified carpal bone, left wrist, initial encounter for closed fracture: Secondary | ICD-10-CM

## 2022-10-09 DIAGNOSIS — M25522 Pain in left elbow: Secondary | ICD-10-CM | POA: Diagnosis not present

## 2022-10-09 DIAGNOSIS — W19XXXA Unspecified fall, initial encounter: Secondary | ICD-10-CM

## 2022-10-09 DIAGNOSIS — I10 Essential (primary) hypertension: Secondary | ICD-10-CM

## 2022-10-09 DIAGNOSIS — R600 Localized edema: Secondary | ICD-10-CM | POA: Insufficient documentation

## 2022-10-09 DIAGNOSIS — Z7982 Long term (current) use of aspirin: Secondary | ICD-10-CM | POA: Diagnosis not present

## 2022-10-09 DIAGNOSIS — M545 Low back pain, unspecified: Secondary | ICD-10-CM | POA: Diagnosis not present

## 2022-10-09 DIAGNOSIS — Y92000 Kitchen of unspecified non-institutional (private) residence as  the place of occurrence of the external cause: Secondary | ICD-10-CM | POA: Diagnosis not present

## 2022-10-09 DIAGNOSIS — W01198A Fall on same level from slipping, tripping and stumbling with subsequent striking against other object, initial encounter: Secondary | ICD-10-CM | POA: Diagnosis not present

## 2022-10-09 DIAGNOSIS — S6992XA Unspecified injury of left wrist, hand and finger(s), initial encounter: Secondary | ICD-10-CM | POA: Diagnosis not present

## 2022-10-09 DIAGNOSIS — R269 Unspecified abnormalities of gait and mobility: Secondary | ICD-10-CM | POA: Diagnosis not present

## 2022-10-09 DIAGNOSIS — S0232XA Fracture of orbital floor, left side, initial encounter for closed fracture: Secondary | ICD-10-CM | POA: Diagnosis not present

## 2022-10-09 DIAGNOSIS — S62109A Fracture of unspecified carpal bone, unspecified wrist, initial encounter for closed fracture: Secondary | ICD-10-CM | POA: Insufficient documentation

## 2022-10-09 DIAGNOSIS — S0512XA Contusion of eyeball and orbital tissues, left eye, initial encounter: Secondary | ICD-10-CM

## 2022-10-09 DIAGNOSIS — E876 Hypokalemia: Secondary | ICD-10-CM | POA: Diagnosis not present

## 2022-10-09 DIAGNOSIS — S0003XA Contusion of scalp, initial encounter: Secondary | ICD-10-CM | POA: Diagnosis not present

## 2022-10-09 DIAGNOSIS — M25532 Pain in left wrist: Secondary | ICD-10-CM | POA: Diagnosis not present

## 2022-10-09 DIAGNOSIS — R609 Edema, unspecified: Secondary | ICD-10-CM | POA: Diagnosis not present

## 2022-10-09 DIAGNOSIS — Z043 Encounter for examination and observation following other accident: Secondary | ICD-10-CM | POA: Diagnosis not present

## 2022-10-09 LAB — CBC WITH DIFFERENTIAL/PLATELET
Abs Immature Granulocytes: 0.01 10*3/uL (ref 0.00–0.07)
Basophils Absolute: 0 10*3/uL (ref 0.0–0.1)
Basophils Relative: 1 %
Eosinophils Absolute: 0 10*3/uL (ref 0.0–0.5)
Eosinophils Relative: 0 %
HCT: 35.6 % — ABNORMAL LOW (ref 39.0–52.0)
Hemoglobin: 12.4 g/dL — ABNORMAL LOW (ref 13.0–17.0)
Immature Granulocytes: 0 %
Lymphocytes Relative: 34 %
Lymphs Abs: 1.2 10*3/uL (ref 0.7–4.0)
MCH: 30 pg (ref 26.0–34.0)
MCHC: 34.8 g/dL (ref 30.0–36.0)
MCV: 86.2 fL (ref 80.0–100.0)
Monocytes Absolute: 0.3 10*3/uL (ref 0.1–1.0)
Monocytes Relative: 9 %
Neutro Abs: 2 10*3/uL (ref 1.7–7.7)
Neutrophils Relative %: 56 %
Platelets: 141 10*3/uL — ABNORMAL LOW (ref 150–400)
RBC: 4.13 MIL/uL — ABNORMAL LOW (ref 4.22–5.81)
RDW: 15.1 % (ref 11.5–15.5)
WBC: 3.6 10*3/uL — ABNORMAL LOW (ref 4.0–10.5)
nRBC: 0 % (ref 0.0–0.2)

## 2022-10-09 LAB — COMPREHENSIVE METABOLIC PANEL
ALT: 5 U/L (ref 0–44)
AST: 30 U/L (ref 15–41)
Albumin: 3.8 g/dL (ref 3.5–5.0)
Alkaline Phosphatase: 49 U/L (ref 38–126)
Anion gap: 9 (ref 5–15)
BUN: 24 mg/dL — ABNORMAL HIGH (ref 8–23)
CO2: 26 mmol/L (ref 22–32)
Calcium: 9 mg/dL (ref 8.9–10.3)
Chloride: 102 mmol/L (ref 98–111)
Creatinine, Ser: 1.04 mg/dL (ref 0.61–1.24)
GFR, Estimated: >60 mL/min (ref >60)
Glucose, Bld: 125 mg/dL — ABNORMAL HIGH (ref 70–99)
Potassium: 3.2 mmol/L — ABNORMAL LOW (ref 3.5–5.1)
Sodium: 137 mmol/L (ref 135–145)
Total Bilirubin: 0.7 mg/dL (ref 0.3–1.2)
Total Protein: 6.7 g/dL (ref 6.5–8.1)

## 2022-10-09 LAB — URINALYSIS, ROUTINE W REFLEX MICROSCOPIC
Bacteria, UA: NONE SEEN
Bilirubin Urine: NEGATIVE
Glucose, UA: 50 mg/dL — AB
Ketones, ur: NEGATIVE mg/dL
Leukocytes,Ua: NEGATIVE
Nitrite: NEGATIVE
Protein, ur: 100 mg/dL — AB
Specific Gravity, Urine: 1.008 (ref 1.005–1.030)
pH: 8 (ref 5.0–8.0)

## 2022-10-09 MED ORDER — DONEPEZIL HCL 5 MG PO TABS: 5.0000 mg | ORAL_TABLET | Freq: Every day | ORAL | 3 refills | Status: DC

## 2022-10-09 MED ORDER — ACETAMINOPHEN 325 MG PO TABS: 650.0000 mg | ORAL_TABLET | Freq: Four times a day (QID) | ORAL | Status: DC | PRN

## 2022-10-09 MED ORDER — OLMESARTAN MEDOXOMIL-HCTZ 40-25 MG PO TABS: 1.0000 | ORAL_TABLET | Freq: Every day | ORAL | 3 refills | Status: DC

## 2022-10-09 NOTE — Assessment & Plan Note (Signed)
Falls Neurol ref 

## 2022-10-09 NOTE — ED Notes (Signed)
Discharged by Milinda Hirschfeld

## 2022-10-09 NOTE — Assessment & Plan Note (Addendum)
Worse  Memory issues since 2022 Will ref to Ihor Austin, NP Refusing to take meds Re-start Aricept, ASA

## 2022-10-09 NOTE — ED Triage Notes (Signed)
BIB EMS/ from home/ pt states he was walking to kitchen and lost his balance, fell and hit left forehead on baseboard of wall/ denies LOC/ takes ASA/ skin tear to left elbow/ pt states he's had multiple falls recently/ pt A&Ox3, disoriented to time. Hypertensive at this time

## 2022-10-09 NOTE — Assessment & Plan Note (Addendum)
Low BP now Refusing to take meds Hydrate well Re-start Olmesartan HCT in 2-3 d if BP is up

## 2022-10-09 NOTE — Assessment & Plan Note (Addendum)
New Acute mildly comminuted, mildly displaced intra-articular fracture of the distal radius. Splint Ortho ref

## 2022-10-09 NOTE — ED Provider Notes (Signed)
[place] EMERGENCY DEPARTMENT AT [facility] Provider Note   CSN: [id number] Arrival date & time: 10/09/22  0345     History  Chief Complaint  Patient presents with   Joshua Adkins is a 87 y.o. male.  The history is provided by the patient, the EMS personnel and medical records.  Fall  Joshua Adkins is a 87 y.o. male who presents to the Emergency Department complaining of fall.  He presents to the emergency department by EMS from home for evaluation of injuries following a fall.  He states that he was walking and fell, unsure if he lost his balance or tripped.  He did strike his head but did not lose consciousness.  He states that he was able to get himself back up off the floor and his son-in-law, who lives with him was able to call 911.  Patient denies any recent illnesses.  He complains of pain to his head as well as to his left hand.  He is unsure of his current medical problems or medications.     Home Medications Prior to Admission medications   Medication Sig Start Date End Date Taking? Authorizing Provider  Ascorbic Acid (VITAMIN C) 1000 MG tablet Take 1,000 mg by mouth every other day. Reported on 09/05/2015    [provider]  aspirin EC 81 MG tablet Take 81 mg by mouth daily.    [provider]  atorvastatin (LIPITOR) 10 MG tablet Take 1 tablet (10 mg total) by mouth daily. Take with dinner 11/12/21   Plotnikov, Georgina Quint, MD  Cholecalciferol 1000 UNITS tablet Take 1,000 Units by mouth every other day.    [provider]  donepezil (ARICEPT) 5 MG tablet Take 1 tablet (5 mg total) by mouth at bedtime. 11/12/21   Plotnikov, Georgina Quint, MD  metroNIDAZOLE (FLAGYL) 500 MG tablet Take 1 tablet (500 mg total) by mouth 3 (three) times daily. Patient not taking: Reported on 06/12/2022 02/12/22   Plotnikov, Georgina Quint, MD  olmesartan-hydrochlorothiazide (BENICAR HCT) 40-25 MG tablet Take 1 tablet by mouth daily. Take in AM 11/12/21    Plotnikov, Georgina Quint, MD  PARoxetine (PAXIL) 10 MG tablet Take 1 tablet (10 mg total) by mouth daily. Take in am. 11/12/21   Plotnikov, Georgina Quint, MD  terazosin (HYTRIN) 2 MG capsule TAKE 1 CAPSULE BY MOUTH EVERY DAY AT BEDTIME 11/12/21   Plotnikov, Georgina Quint, MD      Allergies    Amlodipine, Cardura [doxazosin], and Carvedilol    Review of Systems   Review of Systems  All other systems reviewed and are negative.   Physical Exam Updated Vital Signs BP (!) 213/96 (BP Location: Left Arm)   Pulse 66   Temp (!) 97.5 F (36.4 C) (Oral)   Resp 18   Wt 68.5 kg   SpO2 100%   BMI 22.30 kg/m  Physical Exam Vitals and nursing note reviewed.  Constitutional:      Appearance: He is well-developed.  HENT:     Head: Normocephalic.     Comments: Large hematoma with small overlying abrasion over the left eyebrow.  Pupils equal round and reactive, EOMI. Cardiovascular:     Rate and Rhythm: Normal rate and regular rhythm.     Heart sounds: No murmur heard. Pulmonary:     Effort: Pulmonary effort is normal. No respiratory distress.     Breath sounds: Normal breath sounds.  Abdominal:     Palpations: Abdomen is soft.  Tenderness: There is no abdominal tenderness. There is no guarding or rebound.  Musculoskeletal:     Comments: Soft tissue swelling and tenderness over the left wrist.  There is a healing scab over the left dorsal hand.  He is able to flex and extend at the digits and at the wrist and the left hand.  He has a chronic appearing deformity to the left elbow with flexion extension intact at the elbow.  He does have a skin tear over the elbow that is hemostatic.  No tenderness over the hips bilaterally.  Trace edema to bilateral lower extremities.  Skin:    General: Skin is warm and dry.  Neurological:     Mental Status: He is alert.     Comments: Oriented to hospital, person and recent events.  Disoriented to time.  5 out of 5 strength in all 4 extremities.  Psychiatric:         Behavior: Behavior normal.     ED Results / Procedures / Treatments   Labs (all labs ordered are listed, but only abnormal results are displayed) Labs Reviewed  COMPREHENSIVE METABOLIC PANEL - Abnormal; Notable for the following components:      Result Value   Potassium 3.2 (*)    Glucose, Bld 125 (*)    BUN 24 (*)    All other components within normal limits  CBC WITH DIFFERENTIAL/PLATELET - Abnormal; Notable for the following components:   WBC 3.6 (*)    RBC 4.13 (*)    Hemoglobin 12.4 (*)    HCT 35.6 (*)    Platelets 141 (*)    All other components within normal limits  URINALYSIS, ROUTINE W REFLEX MICROSCOPIC - Abnormal; Notable for the following components:   Color, Urine COLORLESS (*)    Glucose, UA 50 (*)    Hgb urine dipstick SMALL (*)    Protein, ur 100 (*)    All other components within normal limits    EKG None  Radiology CT Cervical Spine Wo Contrast  Result Date: 10/09/2022 CLINICAL DATA:  87 year old male status post fall.  Recent falls. EXAM: CT CERVICAL SPINE WITHOUT CONTRAST TECHNIQUE: Multidetector CT imaging of the cervical spine was performed without intravenous contrast. Multiplanar CT image reconstructions were also generated. RADIATION DOSE REDUCTION: This exam was performed according to the departmental dose-optimization program which includes automated exposure control, adjustment of the mA and/or kV according to patient size and/or use of iterative reconstruction technique. COMPARISON:  Head CT today reported separately. FINDINGS: Alignment: Maintained cervical lordosis. Mild degenerative appearing retrolisthesis of C2 on C3. Cervicothoracic junction alignment is within normal limits. Bilateral posterior element alignment is within normal limits. Skull base and vertebrae: Bone mineralization is within normal limits for age. Visualized skull base is intact. No atlanto-occipital dissociation. C1 and C2 appear intact and aligned. No acute osseous abnormality  identified. Soft tissues and spinal canal: No prevertebral fluid or swelling. No visible canal hematoma. Bilateral calcified carotid atherosclerosis at the carotid bifurcations appears bulky. Small volume retained secretions in the hypopharynx. Disc levels: Degenerative developing C3-C4 ankylosis in the left facets and posterior endplates. Widespread advanced disc and endplate degeneration elsewhere, widespread cervical vacuum disc. Degenerative ligamentous hypertrophy about the odontoid. But only mild cervical spinal stenosis is suspected. Upper chest: Visible upper thoracic levels appear grossly intact. Fine chronic postinflammatory nodularity in both upper lungs, questionable underlying pulmonary hyperinflation. IMPRESSION: 1. No acute traumatic injury identified in the cervical spine. 2. Widespread cervical spine degeneration, including developing C3-C4 ankylosis. Mild  degenerative spinal stenosis suspected by CT. 3. Chronic postinflammatory changes in the upper lungs, questionable underlying pulmonary hyperinflation. Electronically Signed   By: Odessa Fleming M.D.   On: 10/09/2022 05:48   CT Head Wo Contrast  Result Date: 10/09/2022 CLINICAL DATA:  87 year old male status post fall walking to kitchen. Multiple recent falls. EXAM: CT HEAD WITHOUT CONTRAST TECHNIQUE: Contiguous axial images were obtained from the base of the skull through the vertex without intravenous contrast. RADIATION DOSE REDUCTION: This exam was performed according to the departmental dose-optimization program which includes automated exposure control, adjustment of the mA and/or kV according to patient size and/or use of iterative reconstruction technique. COMPARISON:  Head CT 07/09/2022. FINDINGS: Brain: Stable cerebral volume. No midline shift, ventriculomegaly, mass effect, evidence of mass lesion, intracranial hemorrhage or evidence of cortically based acute infarction. Patchy and confluent bilateral cerebral white matter hypodensity.  Chronic appearing lacunar infarct tracking along the right anterior limb external capsule is new newly visible since January. Other gray-white differentiation appears stable. No cortical encephalomalacia identified. Vascular: Calcified atherosclerosis at the skull base. No suspicious intracranial vascular hyperdensity. Skull: Stable.  No acute osseous abnormality identified. Sinuses/Orbits: Visualized paranasal sinuses and mastoids are stable and well aerated. Other: Large, broad-based anterior scalp hematoma on the left involving the left preseptal orbit, periorbital soft tissues, maximal at the left forehead where it measures up to 18 mm in thickness on series 3, image 41. Underlying left frontal bone and sinus appear intact. No soft tissue gas identified. Globes and intraorbital soft tissues remain normal. IMPRESSION: 1. Large, broad-based anterior scalp and periorbital hematoma on the left. No underlying skull fracture identified. 2. Progressed cerebral small vessel disease since January, new but chronic appearing right external capsule lacunar infarct. No acute intracranial abnormality by CT. Electronically Signed   By: Odessa Fleming M.D.   On: 10/09/2022 05:45   DG Elbow Complete Left  Result Date: 10/09/2022 CLINICAL DATA:  87 year old male with history of trauma from a fall. Left elbow pain. EXAM: LEFT ELBOW - COMPLETE 3+ VIEW COMPARISON:  No priors. FINDINGS: Four views of the left elbow demonstrate no acute displaced fracture, subluxation or dislocation. A large enthesophyte is noted extending off the dorsal aspect of the olecranon process. Soft tissue swelling is noted adjacent to this. IMPRESSION: 1. No acute osseous abnormality of the left elbow. 2. Soft tissue swelling overlying the olecranon process. Electronically Signed   By: Trudie Reed M.D.   On: 10/09/2022 05:23   DG Wrist Complete Left  Result Date: 10/09/2022 CLINICAL DATA:  87 year old male with history of trauma from a fall. Left wrist  pain. EXAM: LEFT WRIST - COMPLETE 3+ VIEW COMPARISON:  Left wrist radiograph 07/24/2019. FINDINGS: There is an acute minimally displaced mildly comminuted fracture of the volar and medial aspect of the distal radius extending to the radiocarpal joint, with extensive adjacent soft tissue swelling. Fracture fragments are displaced up to 3 mm anteriorly. Distal ulna appears intact, as do the carpal bones. Multifocal joint space narrowing, subchondral sclerosis, subchondral cyst formation and osteophyte formation noted throughout the wrist bones, most severe at the first [facility] joint, compatible with advanced osteoarthritis. IMPRESSION: 1. Acute mildly comminuted, mildly displaced intra-articular fracture of the distal radius, as above. 2. Advanced osteoarthritis in the left wrist, as above. Electronically Signed   By: Trudie Reed M.D.   On: 10/09/2022 05:22   DG Chest 2 View  Result Date: 10/09/2022 CLINICAL DATA:  87 year old male with history of trauma from a fall.  EXAM: CHEST - 2 VIEW COMPARISON:  No priors. FINDINGS: Lung volumes are normal. No consolidative airspace disease. No pleural effusions. No pneumothorax. No pulmonary nodule or mass noted. Pulmonary vasculature and the cardiomediastinal silhouette are within normal limits. Atherosclerosis in the thoracic aorta. IMPRESSION: 1.  No radiographic evidence of acute cardiopulmonary disease. 2. Aortic atherosclerosis. Electronically Signed   By: Trudie Reed M.D.   On: 10/09/2022 05:16    Procedures Procedures    Medications Ordered in ED Medications  acetaminophen (TYLENOL) tablet 650 mg (has no administration in time range)    ED Course/ Medical Decision Making/ A&P                             Medical Decision Making Amount and/or Complexity of Data Reviewed Labs: ordered. Radiology: ordered.  Risk OTC drugs.   Patient with history of dementia here for evaluation following a fall.  He does have a hematoma to the forehead as  well as tenderness and swelling over the left wrist.  CT head is negative for acute traumatic injury aside from forehead hematoma.  Plain films of the wrist are significant for a left distal radius fracture-images personally reviewed and interpreted, agree with radiologist interpretation.  CBC with stable anemia. CMP with mild hypokalemia.  UA is not consistent with UTI.  Additional history was available from patient's son-in-law after patient's initial assessment.  Son-in-law reports similar history as patient and that he sometimes gets tripped over his own feet and had a fall.  Discussed findings of studies of distal radius fracture.  Discussed home care with acetaminophen as needed according to label instructions.  Discussed importance of PCP follow-up as well as orthopedics follow-up.        Final Clinical Impression(s) / ED Diagnoses Final diagnoses:  Fall, initial encounter  Other closed intra-articular fracture of distal end of left radius, initial encounter  Contusion of left orbital tissues, initial encounter    Rx / DC Orders ED Discharge Orders     None         Tilden Fossa, MD 10/09/22 [phone]

## 2022-10-09 NOTE — Progress Notes (Signed)
Subjective:  Patient ID: Joshua Adkins, male    DOB: 1936/04/24  Age: 87 y.o. MRN: [id number]  CC: No chief complaint on file.   HPI Joshua Adkins presents for dementia, dyslipidemia, frequent falls f/u. He is here w/[name]. Refusing to take meds  Pt fell at 1 am this morning. Per ER:   "Patient with history of dementia here for evaluation following a fall.  He does have a hematoma to the forehead as well as tenderness and swelling over the left wrist.  CT head is negative for acute traumatic injury aside from forehead hematoma.  Plain films of the wrist are significant for a left distal radius fracture-images personally reviewed and interpreted, agree with radiologist interpretation.  CBC with stable anemia. CMP with mild hypokalemia.  UA is not consistent with UTI.  Additional history was available from patient's son-in-law after patient's initial assessment.  Son-in-law reports similar history as patient and that he sometimes gets tripped over his own feet and had a fall.  Discussed findings of studies of distal radius fracture.  Discussed home care with acetaminophen as needed according to label instructions.  Discussed importance of PCP follow-up as well as orthopedics follow-up. "  Outpatient Medications Prior to Visit  Medication Sig Dispense Refill   aspirin EC 81 MG tablet Take 81 mg by mouth daily. (Patient not taking: Reported on 10/09/2022)     Cholecalciferol 1000 UNITS tablet Take 1,000 Units by mouth every other day. (Patient not taking: Reported on 10/09/2022)     Ascorbic Acid (VITAMIN C) 1000 MG tablet Take 1,000 mg by mouth every other day. Reported on 09/05/2015 (Patient not taking: Reported on 10/09/2022)     atorvastatin (LIPITOR) 10 MG tablet Take 1 tablet (10 mg total) by mouth daily. Take with dinner (Patient not taking: Reported on 10/09/2022) 90 tablet 3   donepezil (ARICEPT) 5 MG tablet Take 1 tablet (5 mg total) by mouth at bedtime. (Patient not taking: Reported on  10/09/2022) 90 tablet 3   metroNIDAZOLE (FLAGYL) 500 MG tablet Take 1 tablet (500 mg total) by mouth 3 (three) times daily. (Patient not taking: Reported on 06/12/2022) 21 tablet 0   olmesartan-hydrochlorothiazide (BENICAR HCT) 40-25 MG tablet Take 1 tablet by mouth daily. Take in AM (Patient not taking: Reported on 10/09/2022) 90 tablet 3   PARoxetine (PAXIL) 10 MG tablet Take 1 tablet (10 mg total) by mouth daily. Take in am. (Patient not taking: Reported on 10/09/2022) 90 tablet 1   terazosin (HYTRIN) 2 MG capsule TAKE 1 CAPSULE BY MOUTH EVERY DAY AT BEDTIME (Patient not taking: Reported on 10/09/2022) 90 capsule 3   No facility-administered medications prior to visit.    ROS: Review of Systems  Constitutional:  Positive for fatigue. Negative for appetite change and unexpected weight change.  HENT:  Negative for congestion, nosebleeds, sneezing, sore throat and trouble swallowing.   Eyes:  Negative for itching and visual disturbance.  Respiratory:  Negative for cough.   Cardiovascular:  Negative for chest pain, palpitations and leg swelling.  Gastrointestinal:  Negative for abdominal distention, blood in stool, diarrhea and nausea.  Genitourinary:  Negative for frequency and hematuria.  Musculoskeletal:  Positive for gait problem. Negative for back pain, joint swelling and neck pain.  Skin:  Positive for color change and wound. Negative for pallor and rash.  Neurological:  Negative for dizziness, tremors, speech difficulty and weakness.  Psychiatric/Behavioral:  Positive for confusion and decreased concentration. Negative for agitation, dysphoric mood, self-injury and sleep  disturbance. The patient is not nervous/anxious.     Objective:  BP (!) 118/56 (BP Location: Right Arm, Patient Position: Sitting, Cuff Size: Normal)   Pulse 74   Temp 98 F (36.7 C) (Oral)   Ht 5\' 9"  (1.753 m)   Wt 130 lb 9.6 oz (59.2 kg)   SpO2 98%   BMI 19.29 kg/m   BP Readings from Last 3 Encounters:   10/13/22 (!) 156/87  10/13/22 (!) 213/82  10/09/22 (!) 118/56    Wt Readings from Last 3 Encounters:  10/13/22 130 lb (59 kg)  10/09/22 130 lb 9.6 oz (59.2 kg)  10/09/22 151 lb (68.5 kg)    Physical Exam Constitutional:      General: He is not in acute distress.    Appearance: He is well-developed. He is ill-appearing. He is not toxic-appearing.     Comments: NAD  Eyes:     Conjunctiva/sclera: Conjunctivae normal.     Pupils: Pupils are equal, round, and reactive to light.  Neck:     Thyroid: No thyromegaly.     Vascular: No JVD.  Cardiovascular:     Rate and Rhythm: Normal rate and regular rhythm.     Heart sounds: Normal heart sounds. No murmur heard.    No friction rub. No gallop.  Pulmonary:     Effort: Pulmonary effort is normal. No respiratory distress.     Breath sounds: Normal breath sounds. No wheezing or rales.  Chest:     Chest wall: No tenderness.  Abdominal:     General: Bowel sounds are normal. There is no distension.     Palpations: Abdomen is soft. There is no mass.     Tenderness: There is no abdominal tenderness. There is no guarding or rebound.  Musculoskeletal:        General: No tenderness. Normal range of motion.     Cervical back: Normal range of motion.  Lymphadenopathy:     Cervical: No cervical adenopathy.  Skin:    General: Skin is warm and dry.     Findings: Bruising present. No rash.  Neurological:     Mental Status: He is alert. He is disoriented.     Cranial Nerves: No cranial nerve deficit.     Motor: Weakness present. No abnormal muscle tone.     Coordination: Coordination abnormal.     Gait: Gait abnormal.     Deep Tendon Reflexes: Reflexes are normal and symmetric.  Psychiatric:        Behavior: Behavior normal.        Thought Content: Thought content normal.   L wrist is in a splint L forehead hematoma  Lab Results  Component Value Date   WBC 3.7 (L) 10/13/2022   HGB 12.1 (L) 10/13/2022   HCT 36.0 (L) 10/13/2022   PLT  149 (L) 10/13/2022   GLUCOSE 94 10/13/2022   CHOL 133 08/16/2020   TRIG 58.0 08/16/2020   HDL 63.70 08/16/2020   LDLCALC 58 08/16/2020   ALT <5 10/13/2022   AST 30 10/13/2022   NA 139 10/13/2022   K 3.5 10/13/2022   CL 101 10/13/2022   CREATININE 0.92 10/13/2022   BUN 26 (H) 10/13/2022   CO2 28 10/13/2022   TSH 2.11 06/12/2022   PSA 1.64 08/16/2020    CT Cervical Spine Wo Contrast  Result Date: 10/09/2022 CLINICAL DATA:  87 year old male status post fall.  Recent falls. EXAM: CT CERVICAL SPINE WITHOUT CONTRAST TECHNIQUE: Multidetector CT imaging of the cervical spine was  performed without intravenous contrast. Multiplanar CT image reconstructions were also generated. RADIATION DOSE REDUCTION: This exam was performed according to the departmental dose-optimization program which includes automated exposure control, adjustment of the mA and/or kV according to patient size and/or use of iterative reconstruction technique. COMPARISON:  Head CT today reported separately. FINDINGS: Alignment: Maintained cervical lordosis. Mild degenerative appearing retrolisthesis of C2 on C3. Cervicothoracic junction alignment is within normal limits. Bilateral posterior element alignment is within normal limits. Skull base and vertebrae: Bone mineralization is within normal limits for age. Visualized skull base is intact. No atlanto-occipital dissociation. C1 and C2 appear intact and aligned. No acute osseous abnormality identified. Soft tissues and spinal canal: No prevertebral fluid or swelling. No visible canal hematoma. Bilateral calcified carotid atherosclerosis at the carotid bifurcations appears bulky. Small volume retained secretions in the hypopharynx. Disc levels: Degenerative developing C3-C4 ankylosis in the left facets and posterior endplates. Widespread advanced disc and endplate degeneration elsewhere, widespread cervical vacuum disc. Degenerative ligamentous hypertrophy about the odontoid. But only  mild cervical spinal stenosis is suspected. Upper chest: Visible upper thoracic levels appear grossly intact. Fine chronic postinflammatory nodularity in both upper lungs, questionable underlying pulmonary hyperinflation. IMPRESSION: 1. No acute traumatic injury identified in the cervical spine. 2. Widespread cervical spine degeneration, including developing C3-C4 ankylosis. Mild degenerative spinal stenosis suspected by CT. 3. Chronic postinflammatory changes in the upper lungs, questionable underlying pulmonary hyperinflation. Electronically Signed   By: Odessa Fleming M.D.   On: 10/09/2022 05:48   CT Head Wo Contrast  Result Date: 10/09/2022 CLINICAL DATA:  87 year old male status post fall walking to kitchen. Multiple recent falls. EXAM: CT HEAD WITHOUT CONTRAST TECHNIQUE: Contiguous axial images were obtained from the base of the skull through the vertex without intravenous contrast. RADIATION DOSE REDUCTION: This exam was performed according to the departmental dose-optimization program which includes automated exposure control, adjustment of the mA and/or kV according to patient size and/or use of iterative reconstruction technique. COMPARISON:  Head CT 07/09/2022. FINDINGS: Brain: Stable cerebral volume. No midline shift, ventriculomegaly, mass effect, evidence of mass lesion, intracranial hemorrhage or evidence of cortically based acute infarction. Patchy and confluent bilateral cerebral white matter hypodensity. Chronic appearing lacunar infarct tracking along the right anterior limb external capsule is new newly visible since January. Other gray-white differentiation appears stable. No cortical encephalomalacia identified. Vascular: Calcified atherosclerosis at the skull base. No suspicious intracranial vascular hyperdensity. Skull: Stable.  No acute osseous abnormality identified. Sinuses/Orbits: Visualized paranasal sinuses and mastoids are stable and well aerated. Other: Large, broad-based anterior scalp  hematoma on the left involving the left preseptal orbit, periorbital soft tissues, maximal at the left forehead where it measures up to 18 mm in thickness on series 3, image 41. Underlying left frontal bone and sinus appear intact. No soft tissue gas identified. Globes and intraorbital soft tissues remain normal. IMPRESSION: 1. Large, broad-based anterior scalp and periorbital hematoma on the left. No underlying skull fracture identified. 2. Progressed cerebral small vessel disease since January, new but chronic appearing right external capsule lacunar infarct. No acute intracranial abnormality by CT. Electronically Signed   By: Odessa Fleming M.D.   On: 10/09/2022 05:45   DG Elbow Complete Left  Result Date: 10/09/2022 CLINICAL DATA:  87 year old male with history of trauma from a fall. Left elbow pain. EXAM: LEFT ELBOW - COMPLETE 3+ VIEW COMPARISON:  No priors. FINDINGS: Four views of the left elbow demonstrate no acute displaced fracture, subluxation or dislocation. A large enthesophyte is noted extending  off the dorsal aspect of the olecranon process. Soft tissue swelling is noted adjacent to this. IMPRESSION: 1. No acute osseous abnormality of the left elbow. 2. Soft tissue swelling overlying the olecranon process. Electronically Signed   By: Trudie Reed M.D.   On: 10/09/2022 05:23   DG Wrist Complete Left  Result Date: 10/09/2022 CLINICAL DATA:  87 year old male with history of trauma from a fall. Left wrist pain. EXAM: LEFT WRIST - COMPLETE 3+ VIEW COMPARISON:  Left wrist radiograph 07/24/2019. FINDINGS: There is an acute minimally displaced mildly comminuted fracture of the volar and medial aspect of the distal radius extending to the radiocarpal joint, with extensive adjacent soft tissue swelling. Fracture fragments are displaced up to 3 mm anteriorly. Distal ulna appears intact, as do the carpal bones. Multifocal joint space narrowing, subchondral sclerosis, subchondral cyst formation and osteophyte  formation noted throughout the wrist bones, most severe at the first [facility] joint, compatible with advanced osteoarthritis. IMPRESSION: 1. Acute mildly comminuted, mildly displaced intra-articular fracture of the distal radius, as above. 2. Advanced osteoarthritis in the left wrist, as above. Electronically Signed   By: Trudie Reed M.D.   On: 10/09/2022 05:22   DG Chest 2 View  Result Date: 10/09/2022 CLINICAL DATA:  87 year old male with history of trauma from a fall. EXAM: CHEST - 2 VIEW COMPARISON:  No priors. FINDINGS: Lung volumes are normal. No consolidative airspace disease. No pleural effusions. No pneumothorax. No pulmonary nodule or mass noted. Pulmonary vasculature and the cardiomediastinal silhouette are within normal limits. Atherosclerosis in the thoracic aorta. IMPRESSION: 1.  No radiographic evidence of acute cardiopulmonary disease. 2. Aortic atherosclerosis. Electronically Signed   By: Trudie Reed M.D.   On: 10/09/2022 05:16    Assessment & Plan:   Problem List Items Addressed This Visit     Essential hypertension    Low BP now Refusing to take meds Hydrate well Re-start Olmesartan HCT in 2-3 d if BP is up      Relevant Medications   olmesartan-hydrochlorothiazide (BENICAR HCT) 40-25 MG tablet   Gait disorder    Falls Neurol ref      Wrist fracture - Primary    New Acute mildly comminuted, mildly displaced intra-articular fracture of the distal radius. Splint Ortho ref       Falls frequently    Multifactorial Neurol ref Head CT - small vessel disease      Relevant Orders   Ambulatory referral to Neurology   Alzheimer disease [facility])    Worse  Memory issues since 2022 Will ref to Ihor Austin, NP Refusing to take meds Re-start Aricept, ASA      Relevant Medications   donepezil (ARICEPT) 5 MG tablet   Other Relevant Orders   Ambulatory referral to Neurology      Meds ordered this encounter  Medications   donepezil (ARICEPT) 5 MG tablet     Sig: Take 1 tablet (5 mg total) by mouth at bedtime.    Dispense:  90 tablet    Refill:  3   olmesartan-hydrochlorothiazide (BENICAR HCT) 40-25 MG tablet    Sig: Take 1 tablet by mouth daily. Take in AM    Dispense:  90 tablet    Refill:  3      Follow-up: Return in about 4 weeks (around 11/06/2022) for a follow-up visit.  Sonda Primes, MD

## 2022-10-09 NOTE — Assessment & Plan Note (Addendum)
Multifactorial Neurol ref Head CT - small vessel disease

## 2022-10-13 ENCOUNTER — Emergency Department (HOSPITAL_COMMUNITY): Payer: Medicare HMO

## 2022-10-13 ENCOUNTER — Ambulatory Visit
Admission: EM | Admit: 2022-10-13 | Discharge: 2022-10-13 | Disposition: A | Discharge status: Home / Self Care | Payer: Medicare HMO | Attending: Internal Medicine | Admitting: Internal Medicine

## 2022-10-13 ENCOUNTER — Encounter (HOSPITAL_COMMUNITY): Payer: Self-pay | Admitting: Emergency Medicine

## 2022-10-13 ENCOUNTER — Emergency Department (HOSPITAL_COMMUNITY)
Admission: EM | Admit: 2022-10-13 | Discharge: 2022-10-13 | Disposition: A | Discharge status: Home / Self Care | Payer: Medicare HMO | Attending: Emergency Medicine | Admitting: Emergency Medicine

## 2022-10-13 ENCOUNTER — Other Ambulatory Visit: Payer: Self-pay

## 2022-10-13 DIAGNOSIS — Z23 Encounter for immunization: Secondary | ICD-10-CM | POA: Insufficient documentation

## 2022-10-13 DIAGNOSIS — Z7982 Long term (current) use of aspirin: Secondary | ICD-10-CM | POA: Insufficient documentation

## 2022-10-13 DIAGNOSIS — S0990XA Unspecified injury of head, initial encounter: Secondary | ICD-10-CM

## 2022-10-13 DIAGNOSIS — S61412A Laceration without foreign body of left hand, initial encounter: Secondary | ICD-10-CM | POA: Insufficient documentation

## 2022-10-13 DIAGNOSIS — S61511A Laceration without foreign body of right wrist, initial encounter: Secondary | ICD-10-CM | POA: Diagnosis not present

## 2022-10-13 DIAGNOSIS — F0390 Unspecified dementia without behavioral disturbance: Secondary | ICD-10-CM | POA: Diagnosis not present

## 2022-10-13 DIAGNOSIS — L98491 Non-pressure chronic ulcer of skin of other sites limited to breakdown of skin: Secondary | ICD-10-CM | POA: Diagnosis not present

## 2022-10-13 DIAGNOSIS — L98499 Non-pressure chronic ulcer of skin of other sites with unspecified severity: Secondary | ICD-10-CM | POA: Diagnosis not present

## 2022-10-13 DIAGNOSIS — S0003XA Contusion of scalp, initial encounter: Secondary | ICD-10-CM | POA: Diagnosis not present

## 2022-10-13 DIAGNOSIS — W010XXA Fall on same level from slipping, tripping and stumbling without subsequent striking against object, initial encounter: Secondary | ICD-10-CM | POA: Diagnosis not present

## 2022-10-13 DIAGNOSIS — S0181XA Laceration without foreign body of other part of head, initial encounter: Secondary | ICD-10-CM | POA: Diagnosis not present

## 2022-10-13 DIAGNOSIS — G309 Alzheimer's disease, unspecified: Secondary | ICD-10-CM | POA: Diagnosis not present

## 2022-10-13 DIAGNOSIS — J449 Chronic obstructive pulmonary disease, unspecified: Secondary | ICD-10-CM | POA: Diagnosis not present

## 2022-10-13 DIAGNOSIS — S0101XA Laceration without foreign body of scalp, initial encounter: Secondary | ICD-10-CM | POA: Diagnosis not present

## 2022-10-13 DIAGNOSIS — W19XXXA Unspecified fall, initial encounter: Secondary | ICD-10-CM

## 2022-10-13 DIAGNOSIS — I10 Essential (primary) hypertension: Secondary | ICD-10-CM | POA: Diagnosis not present

## 2022-10-13 DIAGNOSIS — Z043 Encounter for examination and observation following other accident: Secondary | ICD-10-CM | POA: Diagnosis not present

## 2022-10-13 DIAGNOSIS — S199XXA Unspecified injury of neck, initial encounter: Secondary | ICD-10-CM | POA: Diagnosis not present

## 2022-10-13 DIAGNOSIS — S61419A Laceration without foreign body of unspecified hand, initial encounter: Secondary | ICD-10-CM

## 2022-10-13 LAB — CBC
HCT: 36 % — ABNORMAL LOW (ref 39.0–52.0)
Hemoglobin: 12.1 g/dL — ABNORMAL LOW (ref 13.0–17.0)
MCH: 29.4 pg (ref 26.0–34.0)
MCHC: 33.6 g/dL (ref 30.0–36.0)
MCV: 87.4 fL (ref 80.0–100.0)
Platelets: 149 10*3/uL — ABNORMAL LOW (ref 150–400)
RBC: 4.12 MIL/uL — ABNORMAL LOW (ref 4.22–5.81)
RDW: 15 % (ref 11.5–15.5)
WBC: 3.7 10*3/uL — ABNORMAL LOW (ref 4.0–10.5)
nRBC: 0 % (ref 0.0–0.2)

## 2022-10-13 LAB — COMPREHENSIVE METABOLIC PANEL
ALT: <5 U/L (ref 0–44)
AST: 30 U/L (ref 15–41)
Albumin: 4 g/dL (ref 3.5–5.0)
Alkaline Phosphatase: 50 U/L (ref 38–126)
Anion gap: 10 (ref 5–15)
BUN: 26 mg/dL — ABNORMAL HIGH (ref 8–23)
CO2: 28 mmol/L (ref 22–32)
Calcium: 9.4 mg/dL (ref 8.9–10.3)
Chloride: 101 mmol/L (ref 98–111)
Creatinine, Ser: 0.92 mg/dL (ref 0.61–1.24)
GFR, Estimated: >60 mL/min (ref >60)
Glucose, Bld: 94 mg/dL (ref 70–99)
Potassium: 3.5 mmol/L (ref 3.5–5.1)
Sodium: 139 mmol/L (ref 135–145)
Total Bilirubin: 0.8 mg/dL (ref 0.3–1.2)
Total Protein: 7 g/dL (ref 6.5–8.1)

## 2022-10-13 MED ORDER — TETANUS-DIPHTH-ACELL PERTUSSIS 5-2.5-18.5 LF-MCG/0.5 IM SUSY
0.5000 mL | PREFILLED_SYRINGE | Freq: Once | INTRAMUSCULAR | Status: AC
  Administered 2022-10-13: 0.5 mL via INTRAMUSCULAR
  Filled 2022-10-13: qty 0.5

## 2022-10-13 MED ORDER — LIDOCAINE-EPINEPHRINE-TETRACAINE (LET) TOPICAL GEL
3.0000 mL | Freq: Once | TOPICAL | Status: AC
  Administered 2022-10-13: 3 mL via TOPICAL
  Filled 2022-10-13: qty 3

## 2022-10-13 MED ORDER — ACETAMINOPHEN 160 MG/5ML PO SOLN: 500.0000 mg | Freq: Four times a day (QID) | ORAL | 0 refills | Status: DC | PRN

## 2022-10-13 MED ORDER — ACETAMINOPHEN 160 MG/5ML PO SOLN: 500.0000 mg | Freq: Four times a day (QID) | ORAL | Status: DC | PRN

## 2022-10-13 MED ORDER — MORPHINE SULFATE (PF) 2 MG/ML IV SOLN
2.0000 mg | INTRAVENOUS | Status: DC | PRN
  Administered 2022-10-13: 2 mg via INTRAVENOUS
  Filled 2022-10-13: qty 1

## 2022-10-13 MED ORDER — LIDOCAINE-EPINEPHRINE-TETRACAINE (LET) TOPICAL GEL: 3.0000 mL | Freq: Once | TOPICAL | Status: DC

## 2022-10-13 NOTE — Discharge Instructions (Addendum)
Please go to the emergency department as soon as you leave urgent care for further evaluation and management. ?

## 2022-10-13 NOTE — Discharge Instructions (Addendum)
You were seen today after a fall.  All of your imaging was normal, including x-ray of left hand and wrist, x-ray of right wrist, CT scan of head, CT scan of neck.   Follow-up with orthopedics as scheduled tomorrow morning for your previous old left distal radial fracture, sustained from your fall last week.  Please schedule a follow-up with your primary care doctor to discuss your recurrent falls and possibly starting physical therapy or other interventions to help with this.  We have sent a prescription for liquid Tylenol to your pharmacy.  You may take 500 mg as needed for pain, no sooner than every 6 hours.  It was a pleasure caring for you today in the emergency department.  Please return to the emergency department for any worsening or worrisome symptoms.

## 2022-10-13 NOTE — ED Provider Notes (Addendum)
[place] EMERGENCY DEPARTMENT AT [facility] Provider Note  CSN: [id number] Arrival date & time: 10/13/22  1154  History  Chief Complaint  Patient presents with   Joshua Adkins is a 87 y.o. male.  87 yo man with PMH Alzheimer's dementia, frequent falls, recent left distal radial fracture s/p fall 4/17, hypertension, osteoarthritis, and degenerative disc disease presents from urgent care for repeat fall earlier this morning with head laceration.  Son-in-law is at bedside and provides most of the history.  Reports indicate that patient tripped over something on the floor and that caused him to fall.  Believes he sustained a head laceration from falling onto a dog crate.  Patient denies any prodromal symptoms including presyncope, syncope, dizziness of any kind, orthostasis, chest pain, shortness of breath, and diaphoresis.  Son-in-law was in the kitchen making breakfast when this happened, neither he nor patient believe there was LOC.  Patient not currently taking any medications, per son-in-law patient is refusing to take his pills.  There is question of whether or not he is having difficulty swallowing at this time.  Has aspirin on the list, but again has not taken any medications by mouth in several months per son-in-law.  Is not on any blood thinners.  Did sustain a recent left distal radial fracture s/p fall last Wednesday (4/17).  Is currently in a temporary splint for that.  Son-in-law reports that the patient will not keep the arm in a sling that was provided, and also tries to use the left arm without regard to the splint that is in place.  He believes that this needs to be re-wrapped. They have ortho follow up scheduled for tomorrow morning.   There is a wound over the dorsal left hand with surrounding erythema and swelling.  Son-in-law reports this wound has been there since prior to the fall, however does say it was not this swollen prior to splint application.   Patient is not on any antibiotics for this.    Home Medications Prior to Admission medications   Medication Sig Start Date End Date Taking? Authorizing Provider  aspirin EC 81 MG tablet Take 81 mg by mouth daily. Patient not taking: Reported on 10/09/2022    [provider]  Cholecalciferol 1000 UNITS tablet Take 1,000 Units by mouth every other day. Patient not taking: Reported on 10/09/2022    [provider]  donepezil (ARICEPT) 5 MG tablet Take 1 tablet (5 mg total) by mouth at bedtime. 10/09/22   Plotnikov, Georgina Quint, MD  olmesartan-hydrochlorothiazide (BENICAR HCT) 40-25 MG tablet Take 1 tablet by mouth daily. Take in AM 10/09/22   Plotnikov, Georgina Quint, MD     Allergies    Amlodipine, Cardura [doxazosin], and Carvedilol    Review of Systems   Review of Systems  Constitutional:  Negative for activity change, appetite change, chills, diaphoresis and fever.  HENT:  Negative for congestion.   Gastrointestinal:  Negative for abdominal pain.  Skin:  Positive for color change and wound.  Neurological:  Negative for dizziness, tremors, seizures, syncope, facial asymmetry, light-headedness, numbness and headaches.  Psychiatric/Behavioral:  Positive for confusion. Negative for agitation and hallucinations. The patient is not nervous/anxious and is not hyperactive.    Physical Exam Updated Vital Signs BP (!) 179/76 (BP Location: Right Arm)   Pulse 61   Temp 97.7 F (36.5 C) (Oral)   Resp 16   Ht 5\' 9"  (1.753 m)   Wt 59 kg  SpO2 100%   BMI 19.20 kg/m  Physical Exam Vitals reviewed.  Constitutional:      Appearance: He is cachectic. He is not ill-appearing, toxic-appearing or diaphoretic.  HENT:     Head: Raccoon eyes, abrasion and laceration present.   Eyes:     General: Lids are normal.  Musculoskeletal:     Right wrist: Swelling present. No tenderness.     Left wrist: Laceration present. No deformity or tenderness.       Arms:     Cervical back: Normal  range of motion and neck supple. No edema, rigidity or crepitus. No pain with movement, spinous process tenderness or muscular tenderness. Normal range of motion.  Neurological:     Mental Status: He is alert.   ED Results / Procedures / Treatments   Labs (all labs ordered are listed, but only abnormal results are displayed) Labs Reviewed  CBC - Abnormal; Notable for the following components:      Result Value   WBC 3.7 (*)    RBC 4.12 (*)    Hemoglobin 12.1 (*)    HCT 36.0 (*)    Platelets 149 (*)    All other components within normal limits  COMPREHENSIVE METABOLIC PANEL - Abnormal; Notable for the following components:   BUN 26 (*)    All other components within normal limits    EKG EKG Interpretation  Date/Time:  Sunday October 13 2022 12:41:13 EDT Ventricular Rate:  63 PR Interval:  47 QRS Duration: 108 QT Interval:  447 QTC Calculation: 458 R Axis:   77 Text Interpretation: Sinus rhythm Short PR interval RSR' in V1 or V2, probably normal variant Left ventricular hypertrophy Artifact in lead(s) II III aVR aVF V1 V2 V3 V4 V5 V6 TECHNICALLY DIFFICULT Confirmed by Tanda Rockers ([id number]) on 10/13/2022 12:52:14 PM  Radiology CT Head Wo Contrast  Result Date: 10/13/2022 CLINICAL DATA:  Head trauma. EXAM: CT HEAD WITHOUT CONTRAST CT CERVICAL SPINE WITHOUT CONTRAST TECHNIQUE: Multidetector CT imaging of the head and cervical spine was performed following the standard protocol without intravenous contrast. Multiplanar CT image reconstructions of the cervical spine were also generated. RADIATION DOSE REDUCTION: This exam was performed according to the departmental dose-optimization program which includes automated exposure control, adjustment of the mA and/or kV according to patient size and/or use of iterative reconstruction technique. COMPARISON:  10/09/2022 FINDINGS: CT HEAD FINDINGS Brain: Stable age related cerebral atrophy, ventriculomegaly and periventricular white matter disease.  Remote lacunar type basal ganglia infarcts. No extra-axial fluid collections are identified. No CT findings for acute hemispheric infarction or intracranial hemorrhage. No mass lesions. The brainstem and cerebellum are normal. Vascular: Stable vascular calcifications. No aneurysm or hyperdense vessels. Skull: No skull fracture bone lesions. Sinuses/Orbits: The paranasal sinuses and mastoid air cells are clear. The globes are intact. Other: Slight interval decrease in size of the left frontal/supraorbital scalp hematoma. CT CERVICAL SPINE FINDINGS Alignment: Normal Skull base and vertebrae: No acute fracture. No primary bone lesion or focal pathologic process. Soft tissues and spinal canal: No prevertebral fluid or swelling. No visible canal hematoma. Disc levels: The spinal canal is generous. No significant spinal or foraminal stenosis. Upper chest: The lung apices are grossly clear. Other: No neck mass, adenopathy or hematoma. Stable bilateral carotid artery calcifications IMPRESSION: 1. Stable age related cerebral atrophy, ventriculomegaly and periventricular white matter disease. 2. Remote lacunar type basal ganglia infarcts. 3. No acute intracranial findings or skull fracture. 4. Normal alignment of the cervical spine without acute fracture. Electronically  Signed   By: Rudie Meyer M.D.   On: 10/13/2022 14:34   CT Cervical Spine Wo Contrast  Result Date: 10/13/2022 CLINICAL DATA:  Head trauma. EXAM: CT HEAD WITHOUT CONTRAST CT CERVICAL SPINE WITHOUT CONTRAST TECHNIQUE: Multidetector CT imaging of the head and cervical spine was performed following the standard protocol without intravenous contrast. Multiplanar CT image reconstructions of the cervical spine were also generated. RADIATION DOSE REDUCTION: This exam was performed according to the departmental dose-optimization program which includes automated exposure control, adjustment of the mA and/or kV according to patient size and/or use of iterative  reconstruction technique. COMPARISON:  10/09/2022 FINDINGS: CT HEAD FINDINGS Brain: Stable age related cerebral atrophy, ventriculomegaly and periventricular white matter disease. Remote lacunar type basal ganglia infarcts. No extra-axial fluid collections are identified. No CT findings for acute hemispheric infarction or intracranial hemorrhage. No mass lesions. The brainstem and cerebellum are normal. Vascular: Stable vascular calcifications. No aneurysm or hyperdense vessels. Skull: No skull fracture bone lesions. Sinuses/Orbits: The paranasal sinuses and mastoid air cells are clear. The globes are intact. Other: Slight interval decrease in size of the left frontal/supraorbital scalp hematoma. CT CERVICAL SPINE FINDINGS Alignment: Normal Skull base and vertebrae: No acute fracture. No primary bone lesion or focal pathologic process. Soft tissues and spinal canal: No prevertebral fluid or swelling. No visible canal hematoma. Disc levels: The spinal canal is generous. No significant spinal or foraminal stenosis. Upper chest: The lung apices are grossly clear. Other: No neck mass, adenopathy or hematoma. Stable bilateral carotid artery calcifications IMPRESSION: 1. Stable age related cerebral atrophy, ventriculomegaly and periventricular white matter disease. 2. Remote lacunar type basal ganglia infarcts. 3. No acute intracranial findings or skull fracture. 4. Normal alignment of the cervical spine without acute fracture. Electronically Signed   By: Rudie Meyer M.D.   On: 10/13/2022 14:34   DG Wrist Complete Left  Result Date: 10/13/2022 CLINICAL DATA:  Fall EXAM: LEFT WRIST - COMPLETE 3+ VIEW; LEFT HAND - 2 VIEW COMPARISON:  Left wrist radiographs 10/09/2022. FINDINGS: Overlying cast material obscures fine bony and soft-tissue detail. Wrist: There is a nondisplaced fracture of the distal radial epiphysis along the volar aspect with intra-articular extension. Fracture alignment appears slightly improved  compared to the radiographs from 10/09/2022. There is no new acute fracture. Bony alignment is normal. There is narrowing of the radiocarpal joint and there is advanced degenerative change of the thumb CMC joint, stable. The other joint spaces are overall preserved. There is no erosive change. Hand: There is no acute fracture or dislocation in the hand. Bony alignment is normal. The joint spaces are preserved, aside from the thumb [facility] joint as above. There is no erosive change. The soft tissues are unremarkable. IMPRESSION: 1. Slightly improved alignment of the distal radial fracture compared to the study from 10/09/2022. 2. No new acute fracture or dislocation. Electronically Signed   By: Lesia Hausen M.D.   On: 10/13/2022 13:59   DG Hand 2 View Left  Result Date: 10/13/2022 CLINICAL DATA:  Fall EXAM: LEFT WRIST - COMPLETE 3+ VIEW; LEFT HAND - 2 VIEW COMPARISON:  Left wrist radiographs 10/09/2022. FINDINGS: Overlying cast material obscures fine bony and soft-tissue detail. Wrist: There is a nondisplaced fracture of the distal radial epiphysis along the volar aspect with intra-articular extension. Fracture alignment appears slightly improved compared to the radiographs from 10/09/2022. There is no new acute fracture. Bony alignment is normal. There is narrowing of the radiocarpal joint and there is advanced degenerative change of the  thumb CMC joint, stable. The other joint spaces are overall preserved. There is no erosive change. Hand: There is no acute fracture or dislocation in the hand. Bony alignment is normal. The joint spaces are preserved, aside from the thumb [facility] joint as above. There is no erosive change. The soft tissues are unremarkable. IMPRESSION: 1. Slightly improved alignment of the distal radial fracture compared to the study from 10/09/2022. 2. No new acute fracture or dislocation. Electronically Signed   By: Lesia Hausen M.D.   On: 10/13/2022 13:59   DG Wrist Complete Right  Result Date:  10/13/2022 CLINICAL DATA:  Fall EXAM: RIGHT WRIST - COMPLETE 3+ VIEW COMPARISON:  None Available. FINDINGS: There is no acute fracture or dislocation. Bony alignment is normal. There is advanced degenerative change of the thumb CMC joint. The other joint spaces are preserved. There is no erosive change. The soft tissues are unremarkable. IMPRESSION: No acute fracture or dislocation. Advanced degenerative change of the thumb CMC joint. Electronically Signed   By: Lesia Hausen M.D.   On: 10/13/2022 13:54   DG Chest Portable 1 View  Result Date: 10/13/2022 CLINICAL DATA:  Fall. EXAM: PORTABLE CHEST 1 VIEW COMPARISON:  None Available. FINDINGS: The heart size is normal. Atherosclerotic calcifications are present at the aortic arch. Changes of COPD are again noted. The lungs are clear. The visualized soft tissues and bony thorax are unremarkable. No acute trauma is evident. IMPRESSION: 1. No acute cardiopulmonary disease or significant interval change. 2. COPD. Electronically Signed   By: Marin Roberts M.D.   On: 10/13/2022 13:53    Procedures Wound repair  Date/Time: 10/13/2022 3:32 PM  Performed by: Fayette Pho, MD Authorized by: Sloan Leiter, DO  Consent: Verbal consent obtained. Risks and benefits: risks, benefits and alternatives were discussed Consent given by: guardian Local anesthesia used: yes  Anesthesia: Local anesthesia used: yes Local Anesthetic: LET (lido, epi, tetracaine) Comments: Stapled scalp laceration, 3 staples placed.       Medications Ordered in ED Medications  morphine (PF) 2 MG/ML injection 2 mg (2 mg Intravenous Given 10/13/22 1303)  acetaminophen (TYLENOL) 160 MG/5ML solution 500 mg (has no administration in time range)  Tdap (BOOSTRIX) injection 0.5 mL (has no administration in time range)   ED Course/ Medical Decision Making/ A&P  Medical Decision Making 87 yo man with forehead and right dorsal hand lacerations s/p fall this morning.  Fall was  mechanical in nature, tripped over an item on the floor.  No red flags for syncope, LOC, arrhythmia prior to fall.  Given patient is Alzheimer's dementia and is unable to provide full history or report pain well, will give Tylenol p.o. suspension and IV morphine for pain.  Will scan for injury with CT head, CT C-spine, XR right wrist, XR left wrist and hand.  Labs with CBC, CMP to rule out metabolic or anemic cause for fall.  1:40 pm: CMP wnl. CBC with stable anemia and thrombocytopenia. Patient has arrived to radiology department, will await x-rays and CT.   2:15 pm: Left wrist and hand x-ray showing slightly improved alignment of distal radial fracture, and no new fracture or dislocation.  CXR negative for acute.  X-ray right wrist without acute fracture or dislocation.  Is undergoing CT head and C-spine at this time.  2:50 pm: CT head and C-spine unremarkable, no acute bleed, fracture, or dislocation. Plan to discharge home with care by family. Patient to follow up with orthopedics tomorrow morning as planned.   Amount  and/or Complexity of Data Reviewed Independent Historian: caregiver    Details: Son-in-law at bedside Labs: ordered. Decision-making details documented in ED Course.    Details: CMP, CBC Radiology: ordered. Decision-making details documented in ED Course.    Details: CT head, CT C-spine, XR right wrist, XR left wrist and hand. ECG/medicine tests: ordered. Decision-making details documented in ED Course.    Details: EKG unremarkable.   Risk OTC drugs. Prescription drug management.   Final Clinical Impression(s) / ED Diagnoses Final diagnoses:  Fall, initial encounter  Injury of head, initial encounter  Laceration of dorsum of hand  Skin ulcer of hand, limited to breakdown of skin   Rx / DC Orders ED Discharge Orders     None      Fayette Pho, MD   Fayette Pho, MD 10/13/22 1456    Fayette Pho, MD 10/13/22 1532    Sloan Leiter, DO 10/13/22  1540    Sloan Leiter, DO 11/05/22 [id number]

## 2022-10-13 NOTE — Progress Notes (Signed)
Orthopedic Tech Progress Note Patient Details:  Joshua Adkins [birth year] [id number]  Ortho Devices Type of Ortho Device: Sugartong splint Ortho Device/Splint Location: LUE Ortho Device/Splint Interventions: Ordered, Application, Adjustment   Post Interventions Patient Tolerated: Well Instructions Provided: Care of device New splint applied per provider request. Upon arriving to the room the patients splint had been partially taken off and there was a raw bit of skin on the dorsal aspect of the hand, I covered this with guaze and then removed the rest of the splint where I found fiberglass sticking out on the palmar region and dried blood on the fiberglass that was exposed. Under the soft padding was a tightly wrapped piece of coban that I removed, distally to the coban the patients arm was viably swollen and red. A new splint has been applied with heavy padding, after applying the new splint I elevated the patients arm with blankets to help reduce the swelling in his hand.  Darleen Crocker 10/13/2022, 1:27 PM

## 2022-10-13 NOTE — ED Notes (Signed)
Patient is being discharged from the Urgent Care and sent to the Emergency Department via self . Per haley, patient is in need of higher level of care due to fall, hit head. Patient is aware and verbalizes understanding of plan of care.  Vitals:   10/13/22 1120  BP: (!) 213/82  Pulse: 62  Resp: 16  Temp: 98.1 F (36.7 C)  SpO2: 98%

## 2022-10-13 NOTE — ED Triage Notes (Signed)
Patient arrives in wheelchair coming from UC. Patient fell this morning states there was a step up in the floor and caused him to trip. Laceration to head and right wrist already bandaged at UC. Patient had cast to left wrist from fall on 17th. Patient also have severe bruising around forehead and bilateral eyes.

## 2022-10-13 NOTE — ED Provider Notes (Signed)
[name]    CSN: [id number] Arrival date & time: 10/13/22  1112      History   Chief Complaint Chief Complaint  Patient presents with   Fall    HPI Joshua Adkins is a 87 y.o. male.   Patient presents after fall that occurred prior to arrival to urgent care today.  Patient reports with his son in law who helps provide history given patient's history of dementia.  Son in law reports that he thinks that he tripped over a drawer in the bedroom causing him to fall and hit his head.  They are not exactly sure what he hit his head on but thinks that he hit his head and his wrist on a dog kennel in the same room.  He denies any loss of consciousness.  He does not take any blood thinning medications.  He has a laceration to the head and to the right wrist.  Patient is not reporting any wrist pain.  He had a fall a few days prior as well where he was evaluated in the ED for a different head injury and a left wrist fracture.  Patient's blood pressure was also significantly elevated but they report that he has not been taking his medications as prescribed because "he just won't".  They think that he has difficulty swallowing pills but is not sure.  Unable to adequately assess any associated symptoms with blood pressure given patient has dementia and is not answering questions.  Patient has diffuse bruising to face but son-in-law reports that this is due to prior fall. They are not sure of last tetanus vaccine.    Fall    Past Medical History:  Diagnosis Date   Hyperlipidemia    Hypertension     Patient Active Problem List   Diagnosis Date Noted   Wrist fracture 10/09/2022   Falls frequently 10/09/2022   Alzheimer disease 10/09/2022   Head injury 06/12/2022   Gait disorder 06/12/2022   Meteorism 02/12/2022   Depression 01/30/2021   Memory change 08/16/2020   DOE (dyspnea on exertion) 08/16/2020   Acute right-sided low back pain with right-sided sciatica 02/03/2020    Acute right hip pain 02/03/2020   DDD (degenerative disc disease), lumbar 02/03/2020   Primary osteoarthritis of right hip 02/03/2020   Lightheadedness 09/22/2019   Laceration of hand 08/05/2019   Weight loss 02/11/2019   Mild cognitive disorder 08/06/2018   Grief 04/25/2017   Bladder neck obstruction 04/12/2016   Irregular heart beats 04/12/2016   Tick bite 09/05/2015   Well adult exam 02/17/2015   Essential hypertension 02/17/2015   Dyslipidemia 02/17/2015   Cerumen impaction 02/17/2015   Hearing loss 02/17/2015    Past Surgical History:  Procedure Laterality Date   APPENDECTOMY         Home Medications    Prior to Admission medications   Medication Sig Start Date End Date Taking? Authorizing Provider  aspirin EC 81 MG tablet Take 81 mg by mouth daily. Patient not taking: Reported on 10/09/2022    [provider]  Cholecalciferol 1000 UNITS tablet Take 1,000 Units by mouth every other day. Patient not taking: Reported on 10/09/2022    [provider]  donepezil (ARICEPT) 5 MG tablet Take 1 tablet (5 mg total) by mouth at bedtime. 10/09/22   Plotnikov, Georgina Quint, MD  olmesartan-hydrochlorothiazide (BENICAR HCT) 40-25 MG tablet Take 1 tablet by mouth daily. Take in AM 10/09/22   Plotnikov, Georgina Quint, MD  Family History Family History  Problem Relation Age of Onset   Cancer Mother        Breast   Heart disease Father    Cancer Sister        Colon    Cancer Daughter        Brain    Social History Social History   Tobacco Use   Smoking status: Never   Smokeless tobacco: Never  Vaping Use   Vaping Use: Never used  Substance Use Topics   Alcohol use: No   Drug use: No     Allergies   Amlodipine, Cardura [doxazosin], and Carvedilol   Review of Systems Review of Systems Per HPI  Physical Exam Triage Vital Signs ED Triage Vitals [10/13/22 1120]  Enc Vitals Group     BP (!) 213/82     Pulse Rate 62     Resp 16     Temp 98.1 F  (36.7 C)     Temp Source Oral     SpO2 98 %     Weight      Height      Head Circumference      Peak Flow      Pain Score 5     Pain Loc      Pain Edu?      Excl. in GC?    No data found.  Updated Vital Signs BP (!) 213/82 (BP Location: Right Arm)   Pulse 62   Temp 98.1 F (36.7 C) (Oral)   Resp 16   SpO2 98%   Visual Acuity Right Eye Distance:   Left Eye Distance:   Bilateral Distance:    Right Eye Near:   Left Eye Near:    Bilateral Near:     Physical Exam Constitutional:      General: He is not in acute distress.    Appearance: Normal appearance. He is not toxic-appearing or diaphoretic.  HENT:     Head: Normocephalic and atraumatic.      Comments: Patient has approximately 2.5 to 3 cm linear laceration present to left upper mid forehead that extends slightly into scalp.  Minimal bleeding noted.  Patient has diffuse bruising surrounding bilateral eyes and overlying left eyebrow with associated swelling to left eyebrow. Eyes:     Extraocular Movements: Extraocular movements intact.     Conjunctiva/sclera: Conjunctivae normal.  Pulmonary:     Effort: Pulmonary effort is normal.  Musculoskeletal:     Comments: Patient is not endorsing any tenderness to palpation throughout right wrist, hand, fingers.  Grip strength is 5/5.  Skin:    Comments: Patient has approximately 2 to 2.5 inch linear laceration present to dorsal surface of right wrist.  Minimal bleeding noted.  Neurological:     General: No focal deficit present.     Mental Status: He is alert and oriented to person, place, and time. Mental status is at baseline.     Cranial Nerves: Cranial nerves 2-12 are intact.     Sensory: Sensation is intact.     Motor: Motor function is intact.     Comments: Neuro exam appears baseline but unable to adequately assess neuroexam given patient is having difficulty following directions due to dementia.  Psychiatric:        Mood and Affect: Mood normal.         Behavior: Behavior normal.        Thought Content: Thought content normal.        Judgment: Judgment  normal.      UC Treatments / Results  Labs (all labs ordered are listed, but only abnormal results are displayed) Labs Reviewed - No data to display  EKG   Radiology No results found.  Procedures Procedures (including critical care time)  Medications Ordered in UC Medications - No data to display  Initial Impression / Assessment and Plan / UC Course  I have reviewed the triage vital signs and the nursing notes.  Pertinent labs & imaging results that were available during my care of the patient were reviewed by me and considered in my medical decision making (see chart for details).     Given head injury that occurred today and recent head injury, I do think that more emergent evaluation and CT imaging of the head is necessary.  Patient also has significantly elevated blood pressure.  Given both of these factors, son-in-law was advised that he will need to go to the emergency department for further evaluation and management.  They were agreeable with this plan.  Suggested EMS transport but they declined wishing to self transport.  Nonadherent dressings applied to both lacerations by clinical staff prior to discharge.  It appears that tetanus vaccine is up-to-date in 2021. Final Clinical Impressions(s) / UC Diagnoses   Final diagnoses:  Laceration of other part of head without foreign body, initial encounter  Injury of head, initial encounter  Fall, initial encounter  Laceration of right wrist, initial encounter     Discharge Instructions      Please go to the emergency department as soon as you leave urgent care for further evaluation and management.    ED Prescriptions   None    PDMP not reviewed this encounter.   Gustavus Bryant, [place] 10/13/22 1140

## 2022-10-13 NOTE — ED Triage Notes (Signed)
Pt caregiver c/o fall that occurred last wed pt was seen in the ED via EMS. States pt broke the arm and wrist.   Caregiver states pt experienced a second fall today resulting in a laceration to the head and the right wrist.   Caregiver states pt unable to swallow medications and has not taken them "several months ago"

## 2022-10-14 DIAGNOSIS — S52515A Nondisplaced fracture of left radial styloid process, initial encounter for closed fracture: Secondary | ICD-10-CM | POA: Diagnosis not present

## 2022-10-18 ENCOUNTER — Telehealth: Payer: Self-pay

## 2022-10-18 NOTE — Telephone Encounter (Signed)
     Patient  visit on 4/21  at [facility]  Have you been able to follow up with your primary care physician? Yes   The patient was or was not able to obtain any needed medicine or equipment. Yes   Are there diet recommendations that you are having difficulty following? Na   Patient expresses understanding of discharge instructions and education provided has no other needs at this time.  Yes       Joshua Adkins [facility] Guide, [place] [phone] [address], [place], [place] [address] Phone: [phone] Email: [email or web address]    

## 2022-10-28 ENCOUNTER — Ambulatory Visit
Admission: RE | Admit: 2022-10-28 | Discharge: 2022-10-28 | Disposition: A | Discharge status: Home / Self Care | Payer: Medicare HMO | Source: Ambulatory Visit | Attending: Family Medicine | Admitting: Family Medicine

## 2022-10-28 VITALS — BP 172/83 | HR 63 | Temp 98.0°F | Resp 16

## 2022-10-28 DIAGNOSIS — Z4802 Encounter for removal of sutures: Secondary | ICD-10-CM | POA: Diagnosis not present

## 2022-10-28 DIAGNOSIS — S0101XD Laceration without foreign body of scalp, subsequent encounter: Secondary | ICD-10-CM | POA: Diagnosis not present

## 2022-10-28 NOTE — ED Triage Notes (Signed)
Pt presents for staple removal 

## 2022-10-28 NOTE — ED Provider Notes (Signed)
[name]    CSN: [id number] Arrival date & time: 10/28/22  1342      History   Chief Complaint Chief Complaint  Patient presents with   Suture / Staple Removal    HPI Joshua Adkins is a 87 y.o. male.   Patient is here for staple removal.  He had staples placed 2 weeks ago to the scalp after a fall.  He is doing well without complaint today.       Past Medical History:  Diagnosis Date   Hyperlipidemia    Hypertension     Patient Active Problem List   Diagnosis Date Noted   Wrist fracture 10/09/2022   Falls frequently 10/09/2022   Alzheimer disease (HCC) 10/09/2022   Head injury 06/12/2022   Gait disorder 06/12/2022   Meteorism 02/12/2022   Depression 01/30/2021   Memory change 08/16/2020   DOE (dyspnea on exertion) 08/16/2020   Acute right-sided low back pain with right-sided sciatica 02/03/2020   Acute right hip pain 02/03/2020   DDD (degenerative disc disease), lumbar 02/03/2020   Primary osteoarthritis of right hip 02/03/2020   Lightheadedness 09/22/2019   Laceration of hand 08/05/2019   Weight loss 02/11/2019   Mild cognitive disorder 08/06/2018   Grief 04/25/2017   Bladder neck obstruction 04/12/2016   Irregular heart beats 04/12/2016   Tick bite 09/05/2015   Well adult exam 02/17/2015   Essential hypertension 02/17/2015   Dyslipidemia 02/17/2015   Cerumen impaction 02/17/2015   Hearing loss 02/17/2015    Past Surgical History:  Procedure Laterality Date   APPENDECTOMY         Home Medications    Prior to Admission medications   Medication Sig Start Date End Date Taking? Authorizing Provider  acetaminophen (TYLENOL) 160 MG/5ML solution Take 15.6 mLs (500 mg total) by mouth every 6 (six) hours as needed for mild pain. 10/13/22   Fayette Pho, MD  aspirin EC 81 MG tablet Take 81 mg by mouth daily. Patient not taking: Reported on 10/09/2022    [provider]  Cholecalciferol 1000 UNITS tablet Take 1,000 Units  by mouth every other day. Patient not taking: Reported on 10/09/2022    [provider]  donepezil (ARICEPT) 5 MG tablet Take 1 tablet (5 mg total) by mouth at bedtime. 10/09/22   Plotnikov, Georgina Quint, MD  olmesartan-hydrochlorothiazide (BENICAR HCT) 40-25 MG tablet Take 1 tablet by mouth daily. Take in AM 10/09/22   Plotnikov, Georgina Quint, MD    Family History Family History  Problem Relation Age of Onset   Cancer Mother        Breast   Heart disease Father    Cancer Sister        Colon    Cancer Daughter        Brain    Social History Social History   Tobacco Use   Smoking status: Never   Smokeless tobacco: Never  Vaping Use   Vaping Use: Never used  Substance Use Topics   Alcohol use: No   Drug use: No     Allergies   Amlodipine, Cardura [doxazosin], and Carvedilol   Review of Systems Review of Systems  Constitutional: Negative.   HENT: Negative.    Respiratory: Negative.    Cardiovascular: Negative.   Gastrointestinal: Negative.   Musculoskeletal: Negative.      Physical Exam Triage Vital Signs ED Triage Vitals [10/28/22 1414]  Enc Vitals Group     BP (!) 172/83     Pulse  Rate 63     Resp 16     Temp 98 F (36.7 C)     Temp Source Oral     SpO2 98 %     Weight      Height      Head Circumference      Peak Flow      Pain Score 3     Pain Loc      Pain Edu?      Excl. in GC?    No data found.  Updated Vital Signs BP (!) 172/83 (BP Location: Left Arm)   Pulse 63   Temp 98 F (36.7 C) (Oral)   Resp 16   SpO2 98%   Visual Acuity Right Eye Distance:   Left Eye Distance:   Bilateral Distance:    Right Eye Near:   Left Eye Near:    Bilateral Near:     Physical Exam Constitutional:      Appearance: Normal appearance.  Cardiovascular:     Rate and Rhythm: Normal rate.  Pulmonary:     Effort: Pulmonary effort is normal.  Skin:    Comments: Ecchymosis around the eyes bilaterally  Healing laceration at the scalp, 3 staples in  place;  wound is c/d/i  Neurological:     General: No focal deficit present.     Mental Status: He is alert.  Psychiatric:        Mood and Affect: Mood normal.      UC Treatments / Results  Labs (all labs ordered are listed, but only abnormal results are displayed) Labs Reviewed - No data to display  EKG   Radiology No results found.  Procedures 3 staples removed in the normal fashion;  patient tolerated well;   Medications Ordered in UC Medications - No data to display  Initial Impression / Assessment and Plan / UC Course  I have reviewed the triage vital signs and the nursing notes.  Pertinent labs & imaging results that were available during my care of the patient were reviewed by me and considered in my medical decision making (see chart for details).   Final Clinical Impressions(s) / UC Diagnoses   Final diagnoses:  Encounter for staple removal  Laceration of scalp, subsequent encounter     Discharge Instructions      He was seen today for removal of staples.  They were removed easily today.  Follow up as needed.     ED Prescriptions   None    PDMP not reviewed this encounter.   Jannifer Franklin, MD 10/28/22 [phone]

## 2022-10-28 NOTE — Discharge Instructions (Signed)
He was seen today for removal of staples.  They were removed easily today.  Follow up as needed.

## 2022-11-06 ENCOUNTER — Encounter: Payer: Self-pay | Admitting: Internal Medicine

## 2022-11-06 ENCOUNTER — Ambulatory Visit (INDEPENDENT_AMBULATORY_CARE_PROVIDER_SITE_OTHER): Payer: Medicare HMO | Admitting: Internal Medicine

## 2022-11-06 VITALS — BP 150/70 | HR 63 | Temp 97.7°F | Ht 69.0 in | Wt 132.0 lb

## 2022-11-06 DIAGNOSIS — R269 Unspecified abnormalities of gait and mobility: Secondary | ICD-10-CM | POA: Diagnosis not present

## 2022-11-06 DIAGNOSIS — Z66 Do not resuscitate: Secondary | ICD-10-CM | POA: Insufficient documentation

## 2022-11-06 DIAGNOSIS — F0280 Dementia in other diseases classified elsewhere without behavioral disturbance: Secondary | ICD-10-CM | POA: Diagnosis not present

## 2022-11-06 DIAGNOSIS — G309 Alzheimer's disease, unspecified: Secondary | ICD-10-CM

## 2022-11-06 DIAGNOSIS — R296 Repeated falls: Secondary | ICD-10-CM | POA: Diagnosis not present

## 2022-11-06 DIAGNOSIS — R451 Restlessness and agitation: Secondary | ICD-10-CM

## 2022-11-06 MED ORDER — OLMESARTAN MEDOXOMIL-HCTZ 40-25 MG PO TABS: 0.5000 | ORAL_TABLET | Freq: Every day | ORAL | 3 refills | Status: DC

## 2022-11-06 MED ORDER — ARIPIPRAZOLE 2 MG PO TABS
2.0000 mg | ORAL_TABLET | Freq: Every evening | ORAL | 5 refills | Status: DC
Start: 2022-11-06 — End: 2023-02-11

## 2022-11-06 NOTE — Assessment & Plan Note (Signed)
Discussed. NCB discussed, confirmed

## 2022-11-06 NOTE — Assessment & Plan Note (Addendum)
Advanced stage - FTT; rapid progression Hospice referral was made Frequent falls are of concern Falls prevention discussed Noncompliance with treatment discussed with the patient and with his son-in-law Joshua Adkins

## 2022-11-06 NOTE — Assessment & Plan Note (Signed)
Falls, recurrent - use a Hurricane cane, w/c We may need to d/c Aricept

## 2022-11-06 NOTE — Assessment & Plan Note (Signed)
Sundowning: will start on Abilify

## 2022-11-06 NOTE — Progress Notes (Signed)
Subjective:  Patient ID: Joshua Adkins, male    DOB: Jan 25, 1936  Age: 87 y.o. MRN: [id number]  CC: Follow-up (4 week f/u)   HPI [name] presents for FTT, falls, memory loss, insomnia. Refusing to take meds He is here w/[name] C/o sundowning and agitation  Outpatient Medications Prior to Visit  Medication Sig Dispense Refill   acetaminophen (TYLENOL) 160 MG/5ML solution Take 15.6 mLs (500 mg total) by mouth every 6 (six) hours as needed for mild pain. 473 mL 0   donepezil (ARICEPT) 5 MG tablet Take 1 tablet (5 mg total) by mouth at bedtime. 90 tablet 3   olmesartan-hydrochlorothiazide (BENICAR HCT) 40-25 MG tablet Take 1 tablet by mouth daily. Take in AM 90 tablet 3   aspirin EC 81 MG tablet Take 81 mg by mouth daily. (Patient not taking: Reported on 10/09/2022)     Cholecalciferol 1000 UNITS tablet Take 1,000 Units by mouth every other day. (Patient not taking: Reported on 10/09/2022)     No facility-administered medications prior to visit.    ROS: Review of Systems  Constitutional:  Positive for appetite change and fatigue. Negative for unexpected weight change.  HENT:  Negative for congestion, nosebleeds, sneezing, sore throat and trouble swallowing.   Eyes:  Negative for itching and visual disturbance.  Respiratory:  Negative for cough.   Cardiovascular:  Positive for leg swelling. Negative for chest pain and palpitations.  Gastrointestinal:  Negative for abdominal distention, blood in stool, diarrhea and nausea.  Genitourinary:  Negative for frequency and hematuria.  Musculoskeletal:  Positive for gait problem. Negative for back pain, joint swelling and neck pain.  Skin:  Negative for rash.  Neurological:  Positive for dizziness, weakness and light-headedness. Negative for tremors, seizures, syncope and speech difficulty.  Hematological:  Bruises/bleeds easily.  Psychiatric/Behavioral:  Positive for confusion, decreased concentration, dysphoric mood and sleep  disturbance. Negative for agitation and suicidal ideas. The patient is not nervous/anxious.     Objective:  BP (!) 150/70 (BP Location: Right Arm, Patient Position: Sitting, Cuff Size: Normal)   Pulse 63   Temp 97.7 F (36.5 C) (Oral)   Ht 5\' 9"  (1.753 m)   Wt 132 lb (59.9 kg)   SpO2 97%   BMI 19.49 kg/m   BP Readings from Last 3 Encounters:  11/06/22 (!) 150/70  10/28/22 (!) 172/83  10/13/22 (!) 156/87    Wt Readings from Last 3 Encounters:  11/06/22 132 lb (59.9 kg)  10/13/22 130 lb (59 kg)  10/09/22 130 lb 9.6 oz (59.2 kg)    Physical Exam Constitutional:      General: He is not in acute distress.    Appearance: He is well-developed. He is ill-appearing.     Comments: NAD  Eyes:     Conjunctiva/sclera: Conjunctivae normal.     Pupils: Pupils are equal, round, and reactive to light.  Neck:     Thyroid: No thyromegaly.     Vascular: No JVD.  Cardiovascular:     Rate and Rhythm: Normal rate and regular rhythm.     Heart sounds: Normal heart sounds. No murmur heard.    No friction rub. No gallop.  Pulmonary:     Effort: Pulmonary effort is normal. No respiratory distress.     Breath sounds: Normal breath sounds. No wheezing or rales.  Chest:     Chest wall: No tenderness.  Abdominal:     General: Bowel sounds are normal. There is no distension.     Palpations:  Abdomen is soft. There is no mass.     Tenderness: There is no abdominal tenderness. There is no guarding or rebound.  Musculoskeletal:        General: No tenderness. Normal range of motion.     Cervical back: Normal range of motion.  Lymphadenopathy:     Cervical: No cervical adenopathy.  Skin:    General: Skin is warm and dry.     Findings: No rash.  Neurological:     Mental Status: He is alert. He is disoriented.     Cranial Nerves: No cranial nerve deficit.     Motor: Weakness present. No abnormal muscle tone.     Coordination: Coordination abnormal.     Gait: Gait abnormal.     Deep Tendon  Reflexes: Reflexes are normal and symmetric.    Edema L>R Stooped over Unsteady L wrist in a brace     A total time of 45 minutes was spent preparing to see the patient, reviewing tests, x-rays, operative reports and other medical records.  Also, obtaining history and performing comprehensive physical exam.  Additionally, counseling the patient regarding the above listed issues.   Finally, documenting clinical information in the health records, coordination of care, educating the patient and [name] - DNR, Hospice ref   Lab Results  Component Value Date   WBC 3.7 (L) 10/13/2022   HGB 12.1 (L) 10/13/2022   HCT 36.0 (L) 10/13/2022   PLT 149 (L) 10/13/2022   GLUCOSE 94 10/13/2022   CHOL 133 08/16/2020   TRIG 58.0 08/16/2020   HDL 63.70 08/16/2020   LDLCALC 58 08/16/2020   ALT <5 10/13/2022   AST 30 10/13/2022   NA 139 10/13/2022   K 3.5 10/13/2022   CL 101 10/13/2022   CREATININE 0.92 10/13/2022   BUN 26 (H) 10/13/2022   CO2 28 10/13/2022   TSH 2.11 06/12/2022   PSA 1.64 08/16/2020    No results found.  Assessment & Plan:   Problem List Items Addressed This Visit     Gait disorder    Falls, recurrent - use a Hurricane cane, w/c We may need to d/c Aricept      Falls frequently    Falls, recurrent - use a Hurricane cane, w/c We may need to d/c Aricept      Relevant Orders   Ambulatory referral to Hospice   Alzheimer disease (HCC) - Primary    Advanced stage - FTT; rapid progression Hospice referral was made Frequent falls are of concern Falls prevention discussed Noncompliance with treatment discussed with the patient and with his son-in-law [name]      Relevant Medications   ARIPiprazole (ABILIFY) 2 MG tablet   Other Relevant Orders   Ambulatory referral to Hospice   Do not intubate, perform CPR, or defibrillate    Discussed. NCB discussed, confirmed      Agitation    Sundowning: will start on Abilify         Meds ordered this encounter   Medications   olmesartan-hydrochlorothiazide (BENICAR HCT) 40-25 MG tablet    Sig: Take 0.5 tablets by mouth daily. Take in AM    Dispense:  90 tablet    Refill:  3   ARIPiprazole (ABILIFY) 2 MG tablet    Sig: Take 1 tablet (2 mg total) by mouth at bedtime.    Dispense:  30 tablet    Refill:  5      Follow-up: Return in about 2 months (around 01/06/2023) for a follow-up visit.  Sonda Primes, MD

## 2022-11-06 NOTE — Assessment & Plan Note (Signed)
Falls, recurrent - use a Hurricane cane, w/c We may need to d/c Aricept 

## 2022-11-12 ENCOUNTER — Telehealth: Payer: Self-pay | Admitting: *Deleted

## 2022-11-12 DIAGNOSIS — R451 Restlessness and agitation: Secondary | ICD-10-CM

## 2022-11-12 DIAGNOSIS — R296 Repeated falls: Secondary | ICD-10-CM

## 2022-11-12 DIAGNOSIS — R269 Unspecified abnormalities of gait and mobility: Secondary | ICD-10-CM

## 2022-11-12 DIAGNOSIS — F0280 Dementia in other diseases classified elsewhere without behavioral disturbance: Secondary | ICD-10-CM

## 2022-11-12 NOTE — Telephone Encounter (Signed)
It is okay with me.  Thank you 

## 2022-11-12 NOTE — Telephone Encounter (Signed)
Rec'd call from Hospice nurse. She states they where assessing pt for hospice. Pt is not qualify for hospice but would recommend a referral for palliative care instead.Marland KitchenRaechel Adkins

## 2022-11-13 ENCOUNTER — Telehealth: Payer: Self-pay

## 2022-11-13 DIAGNOSIS — S52515D Nondisplaced fracture of left radial styloid process, subsequent encounter for closed fracture with routine healing: Secondary | ICD-10-CM | POA: Diagnosis not present

## 2022-11-13 NOTE — Telephone Encounter (Signed)
Place order for Pallative care...lmb

## 2022-11-13 NOTE — Telephone Encounter (Signed)
(  3:43 pm) PC SW left a message for patient  and his son requesting a call back regarding the palliative care referral received.

## 2022-12-13 ENCOUNTER — Telehealth: Payer: Self-pay

## 2022-12-13 NOTE — Telephone Encounter (Signed)
(  4:12 pm) PC SW completed follow-up call to patient and his son regarding palliative care appointment. SW did not receive an answer.  Patient to be moved to inactive list due to no communication.

## 2023-01-07 ENCOUNTER — Encounter: Payer: Self-pay | Admitting: Internal Medicine

## 2023-01-07 ENCOUNTER — Ambulatory Visit (INDEPENDENT_AMBULATORY_CARE_PROVIDER_SITE_OTHER): Payer: Medicare HMO | Admitting: Internal Medicine

## 2023-01-07 VITALS — BP 118/60 | HR 63 | Temp 98.0°F | Ht 69.0 in | Wt 124.0 lb

## 2023-01-07 DIAGNOSIS — Z66 Do not resuscitate: Secondary | ICD-10-CM

## 2023-01-07 DIAGNOSIS — R296 Repeated falls: Secondary | ICD-10-CM | POA: Diagnosis not present

## 2023-01-07 DIAGNOSIS — F0280 Dementia in other diseases classified elsewhere without behavioral disturbance: Secondary | ICD-10-CM | POA: Diagnosis not present

## 2023-01-07 DIAGNOSIS — R451 Restlessness and agitation: Secondary | ICD-10-CM | POA: Diagnosis not present

## 2023-01-07 DIAGNOSIS — I10 Essential (primary) hypertension: Secondary | ICD-10-CM | POA: Diagnosis not present

## 2023-01-07 DIAGNOSIS — G309 Alzheimer's disease, unspecified: Secondary | ICD-10-CM

## 2023-01-07 DIAGNOSIS — R634 Abnormal weight loss: Secondary | ICD-10-CM | POA: Diagnosis not present

## 2023-01-07 NOTE — Assessment & Plan Note (Signed)
Blood pressure is normal.  The patient does not take his meds.

## 2023-01-07 NOTE — Assessment & Plan Note (Addendum)
CODE STATUS was discussed with the patient and his son-in-law Joshua Adkins again.  Confirmed no CPR.  Yellow form was provided.  Joshua Adkins has been Joshua Adkins's caregiver for many months.  Joshua Adkins appears well-groomed and well-nourished in spite of some weight loss. POA for healthcare was provided to [employer]

## 2023-01-07 NOTE — Assessment & Plan Note (Signed)
Resolved

## 2023-01-07 NOTE — Progress Notes (Signed)
Subjective:  Patient ID: Joshua Adkins, male    DOB: 30-May-1936  Age: 87 y.o. MRN: [id number]  CC: Follow-up (2 [facility] F/U)   HPI [name] presents for HTN, weakness, dementia C/o hallucinations, confusion Not taking other meds She is here with his son-in-law [name]  Outpatient Medications Prior to Visit  Medication Sig Dispense Refill   acetaminophen (TYLENOL) 160 MG/5ML solution Take 15.6 mLs (500 mg total) by mouth every 6 (six) hours as needed for mild pain. 473 mL 0   ARIPiprazole (ABILIFY) 2 MG tablet Take 1 tablet (2 mg total) by mouth at bedtime. 30 tablet 5   donepezil (ARICEPT) 5 MG tablet Take 1 tablet (5 mg total) by mouth at bedtime. 90 tablet 3   olmesartan-hydrochlorothiazide (BENICAR HCT) 40-25 MG tablet Take 0.5 tablets by mouth daily. Take in AM 90 tablet 3   aspirin EC 81 MG tablet Take 81 mg by mouth daily. (Patient not taking: Reported on 10/09/2022)     Cholecalciferol 1000 UNITS tablet Take 1,000 Units by mouth every other day. (Patient not taking: Reported on 10/09/2022)     No facility-administered medications prior to visit.    ROS: Review of Systems  Constitutional:  Positive for fatigue. Negative for appetite change and unexpected weight change.  HENT:  Negative for congestion, nosebleeds, sneezing, sore throat and trouble swallowing.   Eyes:  Negative for itching and visual disturbance.  Respiratory:  Negative for cough.   Cardiovascular:  Negative for chest pain, palpitations and leg swelling.  Gastrointestinal:  Negative for abdominal distention, blood in stool, diarrhea and nausea.  Genitourinary:  Negative for frequency and hematuria.  Musculoskeletal:  Negative for back pain, gait problem, joint swelling and neck pain.  Skin:  Negative for rash.  Neurological:  Negative for dizziness, tremors, speech difficulty and weakness.  Psychiatric/Behavioral:  Positive for behavioral problems, confusion, decreased concentration and hallucinations.  Negative for agitation, dysphoric mood, self-injury, sleep disturbance and suicidal ideas. The patient is not nervous/anxious and is not hyperactive.     Objective:  BP 118/60 (BP Location: Left Arm, Patient Position: Sitting, Cuff Size: Large)   Pulse 63   Temp 98 F (36.7 C) (Oral)   Ht 5\' 9"  (1.753 m)   Wt 124 lb (56.2 kg)   SpO2 97%   BMI 18.31 kg/m   BP Readings from Last 3 Encounters:  01/07/23 118/60  11/06/22 (!) 150/70  10/28/22 (!) 172/83    Wt Readings from Last 3 Encounters:  01/07/23 124 lb (56.2 kg)  11/06/22 132 lb (59.9 kg)  10/13/22 130 lb (59 kg)    Physical Exam Constitutional:      General: He is not in acute distress.    Appearance: He is well-developed.     Comments: NAD  Eyes:     Conjunctiva/sclera: Conjunctivae normal.     Pupils: Pupils are equal, round, and reactive to light.  Neck:     Thyroid: No thyromegaly.     Vascular: No JVD.  Cardiovascular:     Rate and Rhythm: Normal rate and regular rhythm.     Heart sounds: Normal heart sounds. No murmur heard.    No friction rub. No gallop.  Pulmonary:     Effort: Pulmonary effort is normal. No respiratory distress.     Breath sounds: Normal breath sounds. No wheezing or rales.  Chest:     Chest wall: No tenderness.  Abdominal:     General: Bowel sounds are normal. There is no  distension.     Palpations: Abdomen is soft. There is no mass.     Tenderness: There is no abdominal tenderness. There is no guarding or rebound.  Musculoskeletal:        General: No tenderness. Normal range of motion.     Cervical back: Normal range of motion.  Lymphadenopathy:     Cervical: No cervical adenopathy.  Skin:    General: Skin is warm and dry.     Findings: No rash.  Neurological:     Mental Status: He is alert and oriented to person, place, and time.     Cranial Nerves: No cranial nerve deficit.     Motor: No abnormal muscle tone.     Coordination: Coordination normal.     Gait: Gait normal.      Deep Tendon Reflexes: Reflexes are normal and symmetric.  Psychiatric:        Behavior: Behavior normal.        Thought Content: Thought content normal.        Judgment: Judgment normal.   The patient looks better.  CODE STATUS was discussed with the patient and his son-in-law [name].  Confirmed no CPR.  Yellow form was provided     A total time of 45 minutes was spent preparing to see the patient, reviewing tests, x-rays, operative reports and other medical records.  Also, obtaining history and performing comprehensive physical exam.  Additionally, counseling the patient regarding the above listed issues.   Finally, documenting clinical information in the health records, coordination of care, educating [name] and Darrel re POA for healthcare, DNR.   Lab Results  Component Value Date   WBC 3.7 (L) 10/13/2022   HGB 12.1 (L) 10/13/2022   HCT 36.0 (L) 10/13/2022   PLT 149 (L) 10/13/2022   GLUCOSE 94 10/13/2022   CHOL 133 08/16/2020   TRIG 58.0 08/16/2020   HDL 63.70 08/16/2020   LDLCALC 58 08/16/2020   ALT <5 10/13/2022   AST 30 10/13/2022   NA 139 10/13/2022   K 3.5 10/13/2022   CL 101 10/13/2022   CREATININE 0.92 10/13/2022   BUN 26 (H) 10/13/2022   CO2 28 10/13/2022   TSH 2.11 06/12/2022   PSA 1.64 08/16/2020    No results found.  Assessment & Plan:   Problem List Items Addressed This Visit     Essential hypertension    Blood pressure is normal.  The patient does not take his meds.      Weight loss    [name] has been [name]'s caregiver for many months.  [name] appears well-groomed and well-nourished in spite of some weight loss.       Falls frequently    Resolved      Alzheimer disease (HCC) - Primary    Abilify 2 mg prn 1/4-1/2 or 1 at hs      Do not intubate, perform CPR, or defibrillate    CODE STATUS was discussed with the patient and his son-in-law [name] again.  Confirmed no CPR.  Yellow form was provided.  [name] has been [name]'s caregiver for many months.   [name] appears well-groomed and well-nourished in spite of some weight loss. POA for healthcare was provided to [name]      Agitation    Overall better.  Use Abilify 2 mg as needed.  Okay to use a lower dose (one half or 1/4 tablet) if needed. [name] gets too sedated with 2 mg tablet at times.  No orders of the defined types were placed in this encounter.     Follow-up: Return in about 3 months (around 04/09/2023) for a follow-up visit.  Sonda Primes, MD

## 2023-01-07 NOTE — Assessment & Plan Note (Signed)
Abilify 2 mg prn 1/4-1/2 or 1 at hs

## 2023-01-07 NOTE — Assessment & Plan Note (Signed)
Joshua Adkins has been Joshua Adkins's caregiver for many months.  Joshua Adkins appears well-groomed and well-nourished in spite of some weight loss.

## 2023-01-07 NOTE — Assessment & Plan Note (Signed)
Overall better.  Use Abilify 2 mg as needed.  Okay to use a lower dose (one half or 1/4 tablet) if needed. [name] gets too sedated with 2 mg tablet at times.

## 2023-01-22 ENCOUNTER — Encounter (INDEPENDENT_AMBULATORY_CARE_PROVIDER_SITE_OTHER): Payer: Self-pay

## 2023-02-11 ENCOUNTER — Ambulatory Visit: Payer: Medicare HMO | Admitting: Diagnostic Neuroimaging

## 2023-02-11 ENCOUNTER — Encounter: Payer: Self-pay | Admitting: Diagnostic Neuroimaging

## 2023-02-11 VITALS — BP 190/71 | HR 64 | Ht 68.0 in | Wt 132.0 lb

## 2023-02-11 DIAGNOSIS — F03C18 Unspecified dementia, severe, with other behavioral disturbance: Secondary | ICD-10-CM | POA: Diagnosis not present

## 2023-02-11 MED ORDER — SERTRALINE HCL 25 MG PO TABS: 25.0000 mg | ORAL_TABLET | Freq: Every day | ORAL | 6 refills | Status: DC

## 2023-02-11 MED ORDER — QUETIAPINE FUMARATE 25 MG PO TABS: 25.0000 mg | ORAL_TABLET | Freq: Two times a day (BID) | ORAL | 6 refills | Status: DC | PRN

## 2023-02-11 MED ORDER — OLMESARTAN MEDOXOMIL-HCTZ 40-25 MG PO TABS: 0.5000 | ORAL_TABLET | Freq: Every day | ORAL | Status: DC

## 2023-02-11 NOTE — Patient Instructions (Signed)
  MODERATE - SEVERE DEMENTIA (with behavioral changes) - safety / supervision issues reviewed - sertraline 25mg  daily (mood stabilization) - quetiapine 25mg  twice a day as needed for agitation - continue palliative care and supportive care

## 2023-02-11 NOTE — Progress Notes (Unsigned)
[facility]  PATIENT: Joshua Adkins DOB: March 11, 1936  REFERRING CLINICIAN: Plotnikov, Georgina Quint, MD HISTORY FROM: patient and son in law REASON FOR VISIT: new consult   HISTORICAL  CHIEF COMPLAINT:  Chief Complaint  Patient presents with   New Patient (Initial Visit)    Rm 7, here with son in law Darryl Pt is here for frequent falls. Son in law states pt has frequent falls, pt refuses to take any of his medications, states pt has trouble staying asleep.   Pt is here for     HISTORY OF PRESENT ILLNESS:   87 year old male here for evaluation of dementia.  Symptoms started 10 to 12 years ago with gradual onset progressive short-term memory loss, confusion, decline in ADLs.  Patient has been diagnosed with dementia.  Symptoms have significantly worsened over time.  Patient's son-in-law is here with him today and has been living with patient for last few years.  Patient having issues with agitation, insomnia, wandering at night, not taking his medications.   REVIEW OF SYSTEMS: Full 14 system review of systems performed and negative with exception of: as per HPI.  ALLERGIES: Allergies  Allergen Reactions   Amlodipine     edema   Cardura [Doxazosin]     Leg edema   Carvedilol     swelling    HOME MEDICATIONS: Outpatient Medications Prior to Visit  Medication Sig Dispense Refill   acetaminophen (TYLENOL) 160 MG/5ML solution Take 15.6 mLs (500 mg total) by mouth every 6 (six) hours as needed for mild pain. (Patient not taking: Reported on 02/11/2023) 473 mL 0   ARIPiprazole (ABILIFY) 2 MG tablet Take 1 tablet (2 mg total) by mouth at bedtime. (Patient not taking: Reported on 02/11/2023) 30 tablet 5   aspirin EC 81 MG tablet Take 81 mg by mouth daily. (Patient not taking: Reported on 10/09/2022)     Cholecalciferol 1000 UNITS tablet Take 1,000 Units by mouth every other day. (Patient not taking: Reported on 10/09/2022)     donepezil (ARICEPT) 5 MG tablet Take 1  tablet (5 mg total) by mouth at bedtime. (Patient not taking: Reported on 02/11/2023) 90 tablet 3   olmesartan-hydrochlorothiazide (BENICAR HCT) 40-25 MG tablet Take 0.5 tablets by mouth daily. Take in AM (Patient not taking: Reported on 02/11/2023) 90 tablet 3   No facility-administered medications prior to visit.    PAST MEDICAL HISTORY: Past Medical History:  Diagnosis Date   Hyperlipidemia    Hypertension     PAST SURGICAL HISTORY: Past Surgical History:  Procedure Laterality Date   APPENDECTOMY      FAMILY HISTORY: Family History  Problem Relation Age of Onset   Cancer Mother        Breast   Heart disease Father    Cancer Sister        Colon    Cancer Daughter        Brain    SOCIAL HISTORY: Social History   Socioeconomic History   Marital status: Widowed    Spouse name: Not on file   Number of children: Not on file   Years of education: Not on file   Highest education level: Not on file  Occupational History   Not on file  Tobacco Use   Smoking status: Never   Smokeless tobacco: Never  Vaping Use   Vaping status: Never Used  Substance and Sexual Activity   Alcohol use: No   Drug use: No   Sexual activity: Not  on file  Other Topics Concern   Not on file  Social History Narrative   Not on file   Social Determinants of Health   Financial Resource Strain: Low Risk  (02/11/2022)   Overall Financial Resource Strain (CARDIA)    Difficulty of Paying Living Expenses: Not hard at all  Food Insecurity: No Food Insecurity (02/11/2022)   Hunger Vital Sign    Worried About Running Out of Food in the Last Year: Never true    Ran Out of Food in the Last Year: Never true  Transportation Needs: No Transportation Needs (02/11/2022)   PRAPARE - Administrator, Civil Service (Medical): No    Lack of Transportation (Non-Medical): No  Physical Activity: Insufficiently Active (02/11/2022)   Exercise Vital Sign    Days of Exercise per Week: 5 days    Minutes  of Exercise per Session: 10 min  Stress: No Stress Concern Present (02/11/2022)   [employer] of Occupational Health - Occupational Stress Questionnaire    Feeling of Stress : Not at all  Social Connections: Socially Isolated (02/11/2022)   Social Connection and Isolation Panel [NHANES]    Frequency of Communication with Friends and Family: Three times a week    Frequency of Social Gatherings with Friends and Family: Three times a week    Attends Religious Services: Never    Active Member of Clubs or Organizations: No    Attends Banker Meetings: Never    Marital Status: Widowed  Intimate Partner Violence: Not At Risk (02/11/2022)   Humiliation, Afraid, Rape, and Kick questionnaire    Fear of Current or Ex-Partner: No    Emotionally Abused: No    Physically Abused: No    Sexually Abused: No     PHYSICAL EXAM  GENERAL EXAM/CONSTITUTIONAL: Vitals:  Vitals:   02/11/23 1119  BP: (!) 190/71  Pulse: 64  Weight: 132 lb (59.9 kg)  Height: 5\' 8"  (1.727 m)   Body mass index is 20.07 kg/m. Wt Readings from Last 3 Encounters:  02/11/23 132 lb (59.9 kg)  01/07/23 124 lb (56.2 kg)  11/06/22 132 lb (59.9 kg)   Patient is in no distress; well developed, nourished and groomed; neck is supple  CARDIOVASCULAR: Examination of carotid arteries is normal; no carotid bruits Regular rate and rhythm, no murmurs Examination of peripheral vascular system by observation and palpation is normal  EYES: Ophthalmoscopic exam of optic discs and posterior segments is normal; no papilledema or hemorrhages No results found.  MUSCULOSKELETAL: Gait, strength, tone, movements noted in Neurologic exam below  NEUROLOGIC: MENTAL STATUS:      No data to display         awake, alert, oriented to person; "2004, 2ND MONTH, 8TH, [place], [place]" DECR MEMORY  DECR ATTENTION POOR INSIGHT DECR FLUENCY  CRANIAL NERVE:  2nd - no papilledema on fundoscopic exam 2nd, 3rd, 4th, 6th  - pupils equal and reactive to light, visual fields full to confrontation, extraocular muscles intact, no nystagmus 5th - facial sensation symmetric 7th - facial strength symmetric 8th - hearing intact 9th - palate elevates symmetrically, uvula midline 11th - shoulder shrug symmetric 12th - tongue protrusion midline  MOTOR:  normal bulk and tone, DIFFUSE 4/5 strength in the BUE, BLE  SENSORY:  normal and symmetric to light touch, temperature, vibration  COORDINATION:  finger-nose-finger, fine finger movements normal  REFLEXES:  deep tendon reflexes TRACE and symmetric  GAIT/STATION:  IN WHEELCHAIR     DIAGNOSTIC DATA (LABS,  IMAGING, TESTING) - I reviewed patient records, labs, notes, testing and imaging myself where available.  Lab Results  Component Value Date   WBC 3.7 (L) 10/13/2022   HGB 12.1 (L) 10/13/2022   HCT 36.0 (L) 10/13/2022   MCV 87.4 10/13/2022   PLT 149 (L) 10/13/2022      Component Value Date/Time   NA 139 10/13/2022 1258   K 3.5 10/13/2022 1258   CL 101 10/13/2022 1258   CO2 28 10/13/2022 1258   GLUCOSE 94 10/13/2022 1258   BUN 26 (H) 10/13/2022 1258   CREATININE 0.92 10/13/2022 1258   CALCIUM 9.4 10/13/2022 1258   PROT 7.0 10/13/2022 1258   ALBUMIN 4.0 10/13/2022 1258   AST 30 10/13/2022 1258   ALT <5 10/13/2022 1258   ALKPHOS 50 10/13/2022 1258   BILITOT 0.8 10/13/2022 1258   GFRNONAA >60 10/13/2022 1258   Lab Results  Component Value Date   CHOL 133 08/16/2020   HDL 63.70 08/16/2020   LDLCALC 58 08/16/2020   TRIG 58.0 08/16/2020   CHOLHDL 2 08/16/2020   No results found for: "HGBA1C" No results found for: "VITAMINB12" Lab Results  Component Value Date   TSH 2.11 06/12/2022    10/13/22 CT head  1. Stable age related cerebral atrophy, ventriculomegaly and periventricular white matter disease. 2. Remote lacunar type basal ganglia infarcts. 3. No acute intracranial findings or skull fracture. 4. Normal alignment of the cervical  spine without acute fracture.    ASSESSMENT AND PLAN  87 y.o. year old male here with:  Dx:  1. Severe dementia with other behavioral disturbance, unspecified dementia type (HCC)     PLAN:  MODERATE - SEVERE DEMENTIA (with behavioral changes; since ~ 2012) - safety / supervision issues reviewed - sertraline 25mg  daily (mood stabilization) - quetiapine 25mg  twice a day as needed for agitation - continue palliative care and supportive care  Meds ordered this encounter  Medications   sertraline (ZOLOFT) 25 MG tablet    Sig: Take 1 tablet (25 mg total) by mouth daily.    Dispense:  30 tablet    Refill:  6   QUEtiapine (SEROQUEL) 25 MG tablet    Sig: Take 1 tablet (25 mg total) by mouth 2 (two) times daily as needed.    Dispense:  30 tablet    Refill:  6   Return for return to PCP, pending if symptoms worsen or fail to improve.    Suanne Marker, MD 02/11/2023, 12:00 PM Certified in Neurology, Neurophysiology and Neuroimaging  [facility] [address], [address] [place], [place] [address] [phone]

## 2023-02-13 DIAGNOSIS — Z743 Need for continuous supervision: Secondary | ICD-10-CM | POA: Diagnosis not present

## 2023-02-13 DIAGNOSIS — R531 Weakness: Secondary | ICD-10-CM | POA: Diagnosis not present

## 2023-02-13 DIAGNOSIS — R471 Dysarthria and anarthria: Secondary | ICD-10-CM | POA: Diagnosis not present

## 2023-02-13 DIAGNOSIS — I10 Essential (primary) hypertension: Secondary | ICD-10-CM | POA: Diagnosis not present

## 2023-02-13 DIAGNOSIS — W19XXXA Unspecified fall, initial encounter: Secondary | ICD-10-CM | POA: Diagnosis not present

## 2023-02-14 DIAGNOSIS — G309 Alzheimer's disease, unspecified: Secondary | ICD-10-CM | POA: Diagnosis not present

## 2023-02-14 DIAGNOSIS — Y92009 Unspecified place in unspecified non-institutional (private) residence as the place of occurrence of the external cause: Secondary | ICD-10-CM | POA: Diagnosis not present

## 2023-02-14 DIAGNOSIS — R4781 Slurred speech: Secondary | ICD-10-CM | POA: Diagnosis not present

## 2023-02-14 DIAGNOSIS — R627 Adult failure to thrive: Secondary | ICD-10-CM | POA: Diagnosis not present

## 2023-02-14 DIAGNOSIS — I493 Ventricular premature depolarization: Secondary | ICD-10-CM | POA: Diagnosis not present

## 2023-02-14 DIAGNOSIS — E7800 Pure hypercholesterolemia, unspecified: Secondary | ICD-10-CM | POA: Diagnosis not present

## 2023-02-14 DIAGNOSIS — I10 Essential (primary) hypertension: Secondary | ICD-10-CM | POA: Diagnosis not present

## 2023-02-14 DIAGNOSIS — F02811 Dementia in other diseases classified elsewhere, unspecified severity, with agitation: Secondary | ICD-10-CM | POA: Diagnosis not present

## 2023-02-14 DIAGNOSIS — F02818 Dementia in other diseases classified elsewhere, unspecified severity, with other behavioral disturbance: Secondary | ICD-10-CM | POA: Diagnosis not present

## 2023-02-14 DIAGNOSIS — W19XXXA Unspecified fall, initial encounter: Secondary | ICD-10-CM | POA: Diagnosis not present

## 2023-02-14 DIAGNOSIS — R519 Headache, unspecified: Secondary | ICD-10-CM | POA: Diagnosis not present

## 2023-02-14 DIAGNOSIS — I440 Atrioventricular block, first degree: Secondary | ICD-10-CM | POA: Diagnosis not present

## 2023-02-14 DIAGNOSIS — Z981 Arthrodesis status: Secondary | ICD-10-CM | POA: Diagnosis not present

## 2023-02-14 DIAGNOSIS — F0390 Unspecified dementia without behavioral disturbance: Secondary | ICD-10-CM | POA: Diagnosis not present

## 2023-02-14 DIAGNOSIS — R531 Weakness: Secondary | ICD-10-CM | POA: Diagnosis not present

## 2023-02-14 DIAGNOSIS — Z66 Do not resuscitate: Secondary | ICD-10-CM | POA: Diagnosis not present

## 2023-02-14 DIAGNOSIS — E876 Hypokalemia: Secondary | ICD-10-CM | POA: Diagnosis not present

## 2023-02-14 DIAGNOSIS — R471 Dysarthria and anarthria: Secondary | ICD-10-CM | POA: Diagnosis not present

## 2023-02-14 DIAGNOSIS — Z471 Aftercare following joint replacement surgery: Secondary | ICD-10-CM | POA: Diagnosis not present

## 2023-02-15 DIAGNOSIS — R531 Weakness: Secondary | ICD-10-CM | POA: Diagnosis not present

## 2023-02-16 DIAGNOSIS — R531 Weakness: Secondary | ICD-10-CM | POA: Diagnosis not present

## 2023-02-17 DIAGNOSIS — R531 Weakness: Secondary | ICD-10-CM | POA: Diagnosis not present

## 2023-02-17 NOTE — Progress Notes (Signed)
This encounter was created in error - please disregard.  Called patient and his son-in-law picked up the phone and informed that patient was in the hospital in [facility].  He wanted to make sure that information would get to Dr. Posey Rea.

## 2023-02-18 DIAGNOSIS — Z743 Need for continuous supervision: Secondary | ICD-10-CM | POA: Diagnosis not present

## 2023-02-18 DIAGNOSIS — R531 Weakness: Secondary | ICD-10-CM | POA: Diagnosis not present

## 2023-03-03 ENCOUNTER — Telehealth: Payer: Self-pay

## 2023-03-03 NOTE — Telephone Encounter (Signed)
Pt has passed away at home on 2023-03-10 per [facility].

## 2023-03-25 DEATH — deceased

## 2023-04-09 ENCOUNTER — Ambulatory Visit: Payer: Self-pay | Admitting: Internal Medicine
